# Patient Record
Sex: Female | Born: 2000 | Race: Black or African American | Hispanic: No | Marital: Single | Smoking: Never smoker
Health system: Southern US, Community
[De-identification: ages and names within clinical notes are randomized; demographics above are authoritative.]

## PROBLEM LIST (undated history)

## (undated) ENCOUNTER — Inpatient Hospital Stay (HOSPITAL_COMMUNITY): Payer: Self-pay

## (undated) DIAGNOSIS — D649 Anemia, unspecified: Secondary | ICD-10-CM

## (undated) DIAGNOSIS — F419 Anxiety disorder, unspecified: Secondary | ICD-10-CM

## (undated) DIAGNOSIS — A749 Chlamydial infection, unspecified: Secondary | ICD-10-CM

## (undated) DIAGNOSIS — Z789 Other specified health status: Secondary | ICD-10-CM

## (undated) HISTORY — PX: NO PAST SURGERIES: SHX2092

---
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
## 2019-01-31 IMAGING — US US MFM OB COMP +14 WKS
1 series · 14 of 28 positions shown · non-contrast
Comparison: none

[Series 1: us mfm ob comp +14 wks · 14 of 109 slices shown]
[im 5/109]
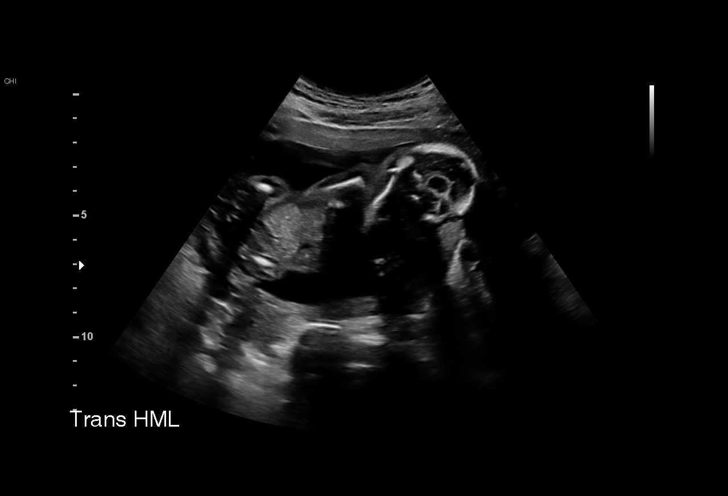
[im 13/109]
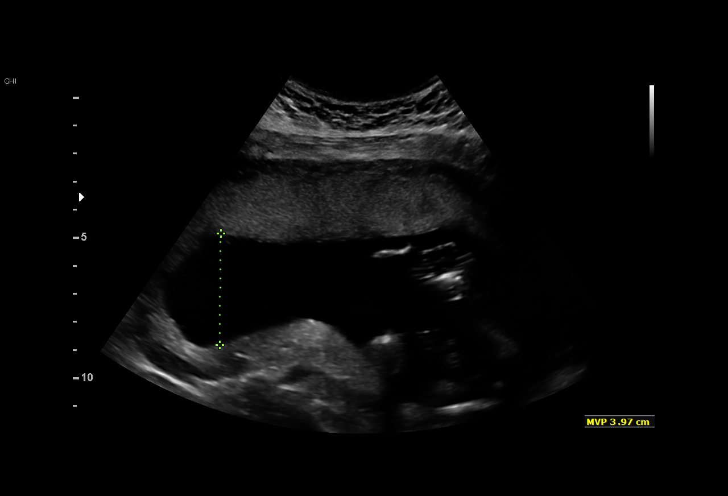
[im 21/109]
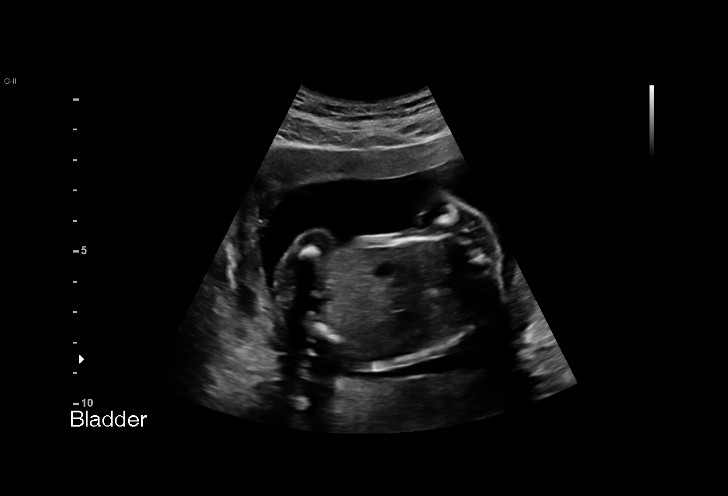
[im 29/109]
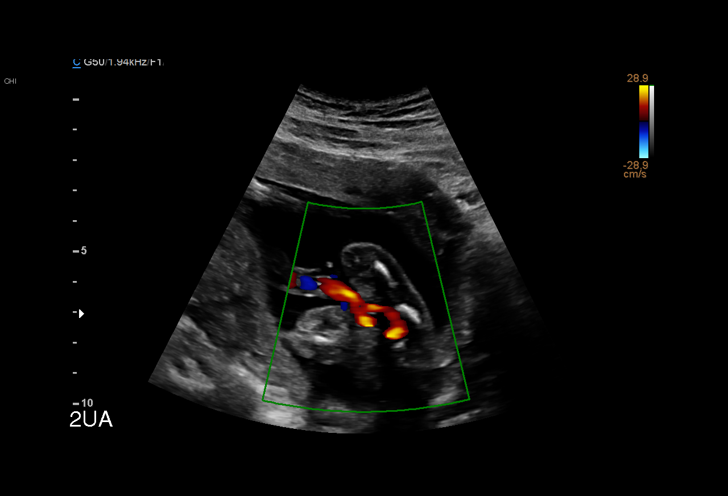
[im 37/109]
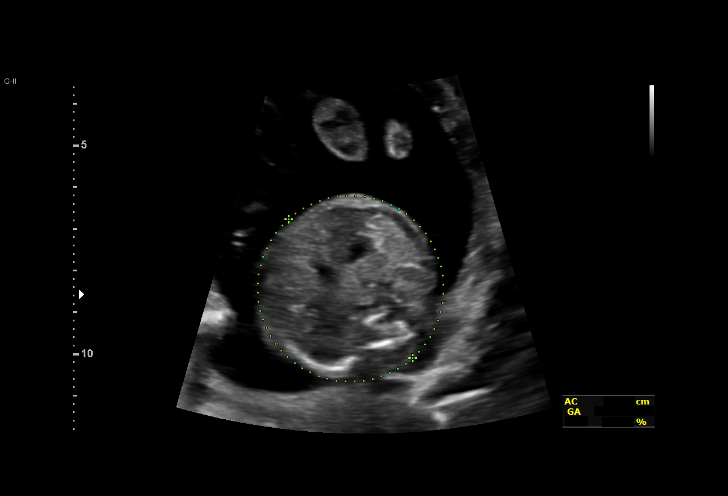
[im 45/109]
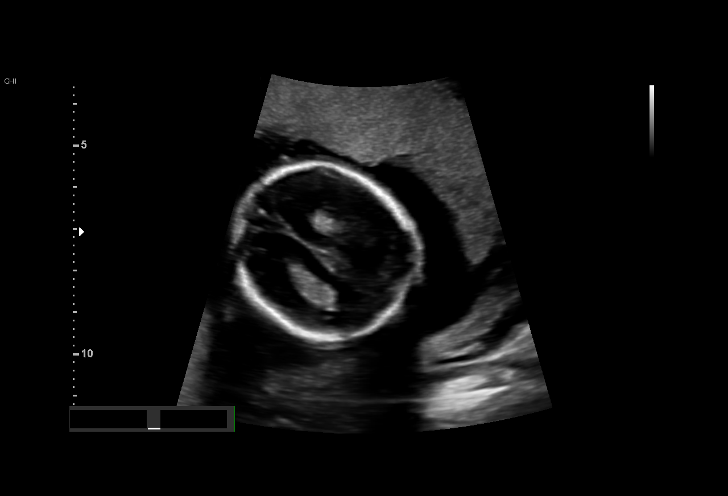
[im 53/109]
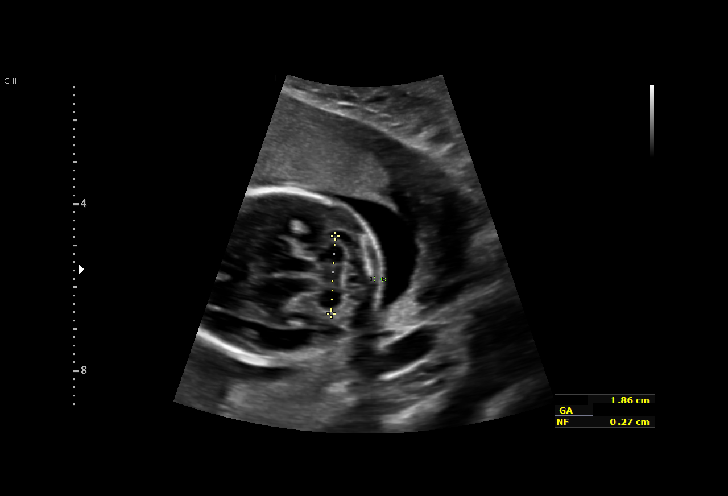
[im 61/109]
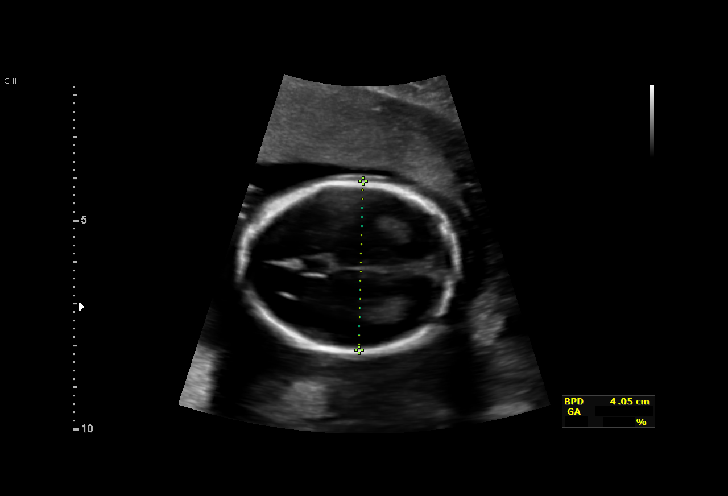
[im 69/109]
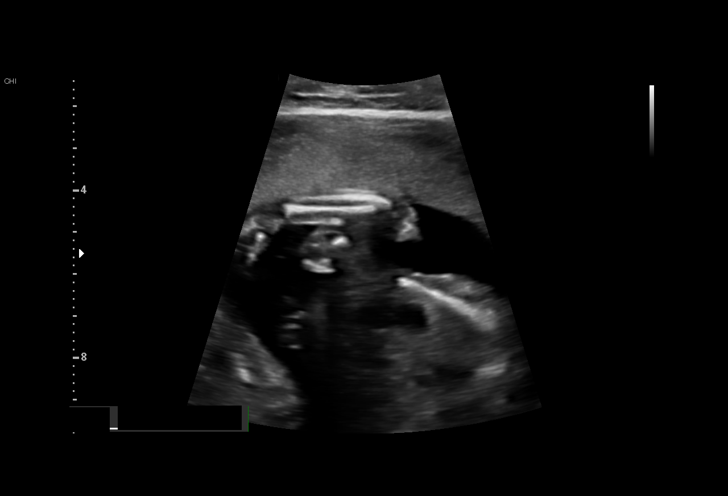
[im 77/109]
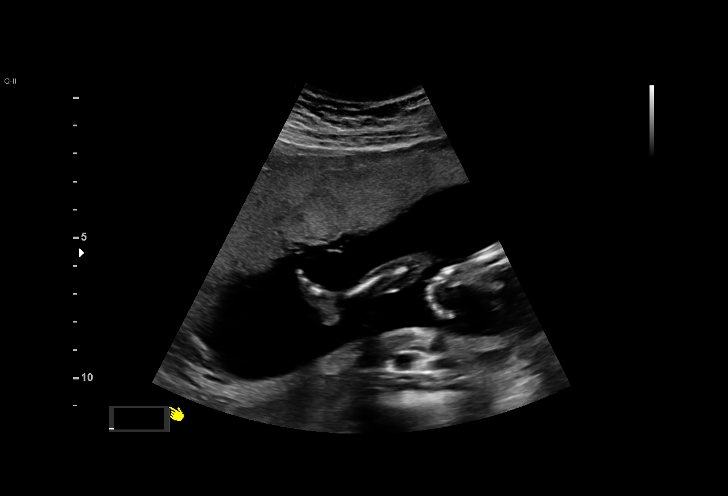
[im 85/109]
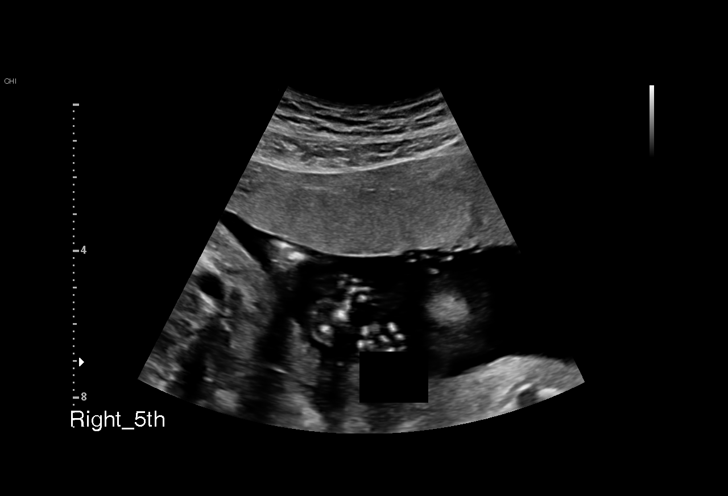
[im 93/109]
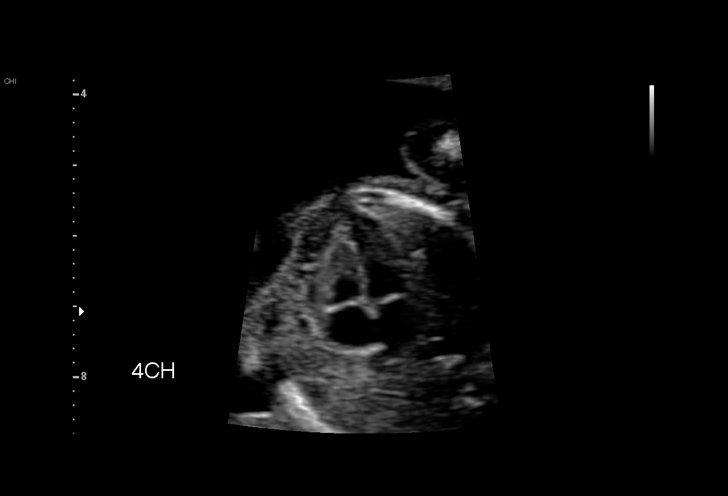
[im 101/109]
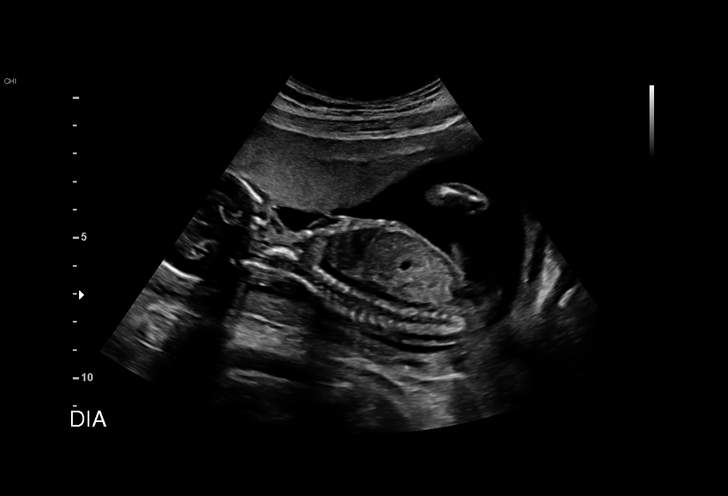
[im 109/109]
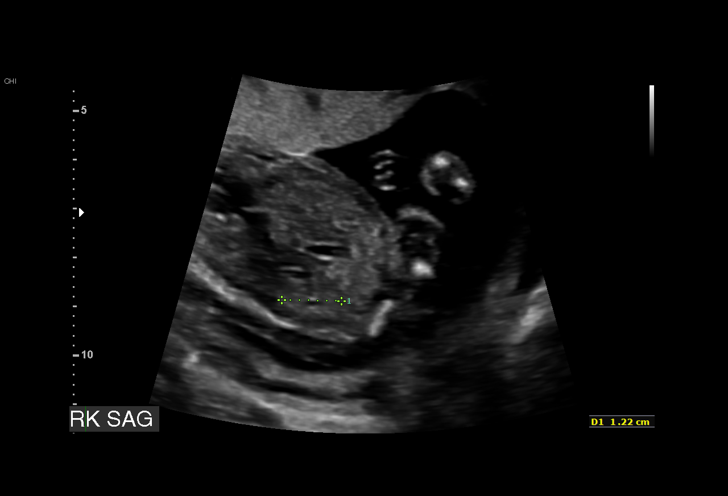

[14 of 28 positions shown; findings below may reference images not displayed]

1  ROSASANA XIGADINHOO           [id number]      [id number]     [id number]
Indications

19 weeks gestation of pregnancy
Encounter for antenatal screening for
malformations
OB History

Gravidity:    2         Term:   1
Fetal Evaluation

Num Of Fetuses:     1
Cardiac Activity:   Observed
Presentation:       Variable
Placenta:           Anterior, above cervical os
P. Cord Insertion:  Visualized, central

Amniotic Fluid
AFI FV:      Subjectively within normal limits

Largest Pocket(cm)
3.97
Biometry

BPD:      40.7  mm     G. Age:  18w 2d         23  %    CI:         75.17  %    70 - 86
FL/HC:       18.6  %    16.1 -
HC:      148.9  mm     G. Age:  18w 0d          7  %    HC/AC:       1.07       1.09 -
AC:      139.3  mm     G. Age:  19w 2d         57  %    FL/BPD:      68.1  %
FL:       27.7  mm     G. Age:  18w 3d         25  %    FL/AC:       19.9  %    20 - 24

Est. FW:     259   gm     0 lb 9 oz     41  %
Gestational Age

LMP:           19w 0d        Date:  05/29/17                 EDD:    03/05/18
U/S Today:     18w 4d                                        EDD:    03/08/18
Best:          19w 0d     Det. By:  LMP  (05/29/17)          EDD:    03/05/18
Anatomy

Cranium:               Appears normal         Aortic Arch:            Not well visualized
Cavum:                 Appears normal         Ductal Arch:            Not well visualized
Ventricles:            Appears normal         Diaphragm:              Appears normal
Choroid Plexus:        Appears normal         Stomach:                Appears normal, left
sided
Cerebellum:            Appears normal         Abdomen:                Appears normal
Posterior Fossa:       Appears normal         Abdominal Wall:         Appears nml (cord
insert, abd wall)
Nuchal Fold:           Appears normal         Cord Vessels:           Appears normal (3
vessel cord)
Face:                  Orbits appear          Kidneys:                Appear normal
normal
Lips:                  Appears normal         Bladder:                Appears normal
Thoracic:              Appears normal         Spine:                  Not well visualized
Heart:                 Appears normal         Upper Extremities:      Appears normal
(4CH, axis, and situs
RVOT:                  Not well visualized    Lower Extremities:      Appears normal
LVOT:                  Not well visualized

Other:  Fetus appears to be a female. Heels and 5th digit visualized. Unable
to visualize the spine, profile and outflow tracts.
Cervix Uterus Adnexa

Cervix
Length:           3.65  cm.
Normal appearance by transabdominal scan.

Uterus
No abnormality visualized.

Left Ovary
Not visualized.

Right Ovary
Within normal limits.

Cul De Sac:   No free fluid seen.

Adnexa:       No abnormality visualized.
Impression

SIUP at 19+0 weeks
Normal detailed fetal anatomy; limited views of profile, heart
and spine
Markers of aneuploidy: none
Normal amniotic fluid volume
Measurements consistent with LMP dating
Recommendations

Follow-up ultrasound in 4 weeks to complete anatomy survey

## 2019-03-11 IMAGING — US US MFM OB FOLLOW-UP
1 series · 14 of 28 positions shown · non-contrast
Comparison: none

[Series 1: us mfm ob follow-up · 14 of 58 slices shown]
[im 3/58]
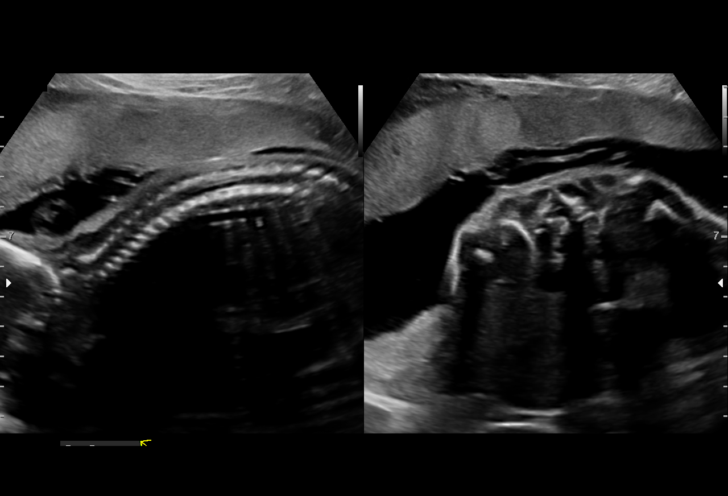
[im 7/58]
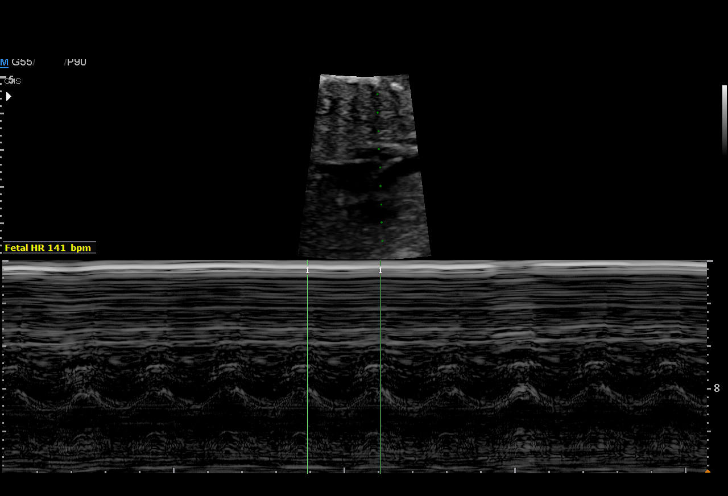
[im 11/58]
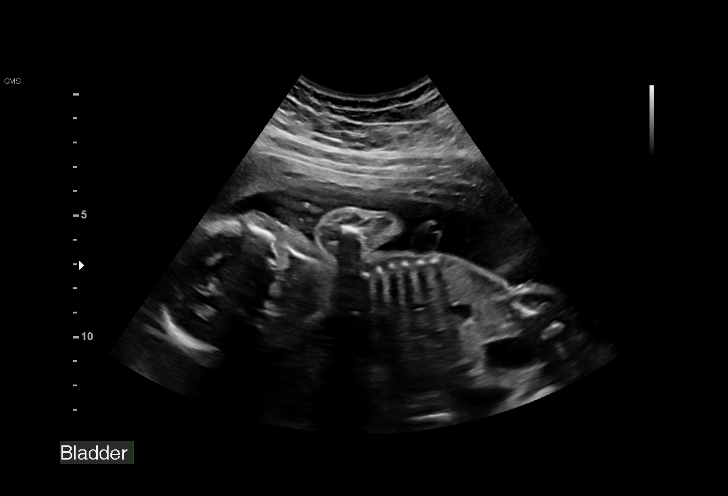
[im 15/58]
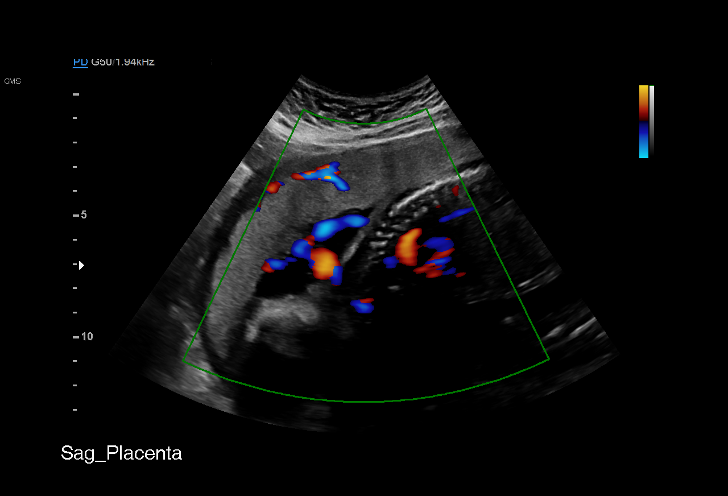
[im 20/58]
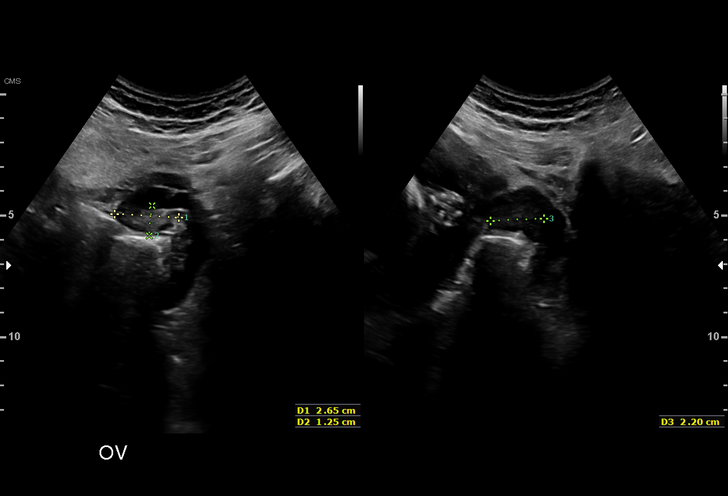
[im 24/58]
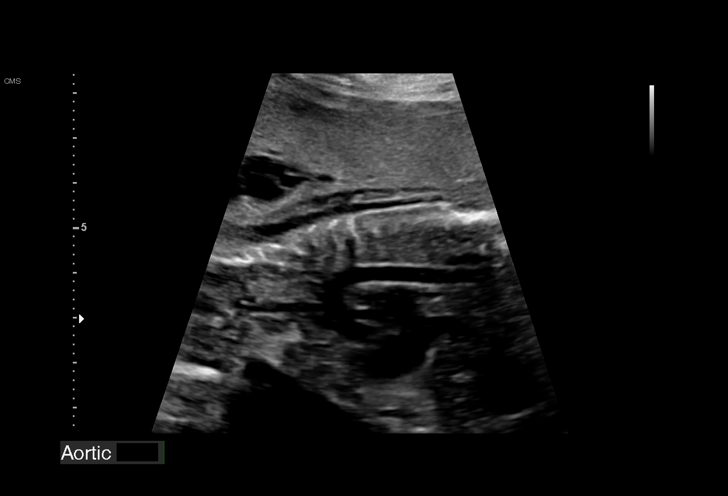
[im 28/58]
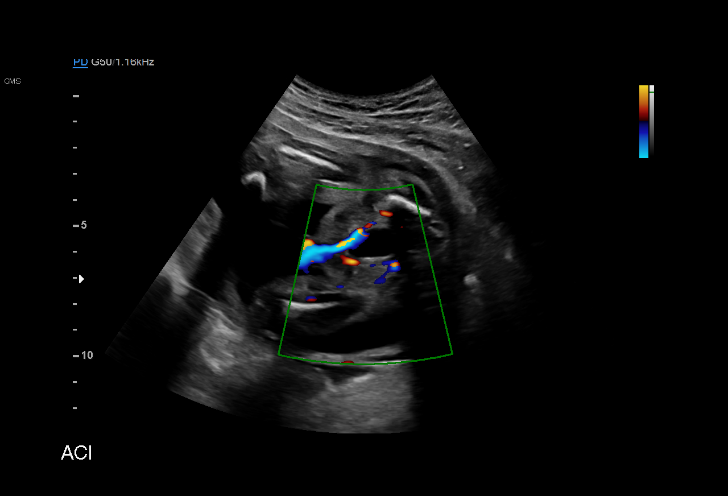
[im 32/58]
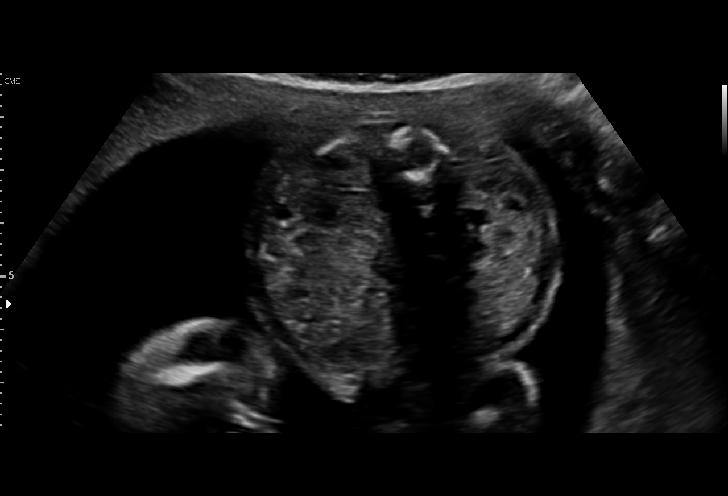
[im 36/58]
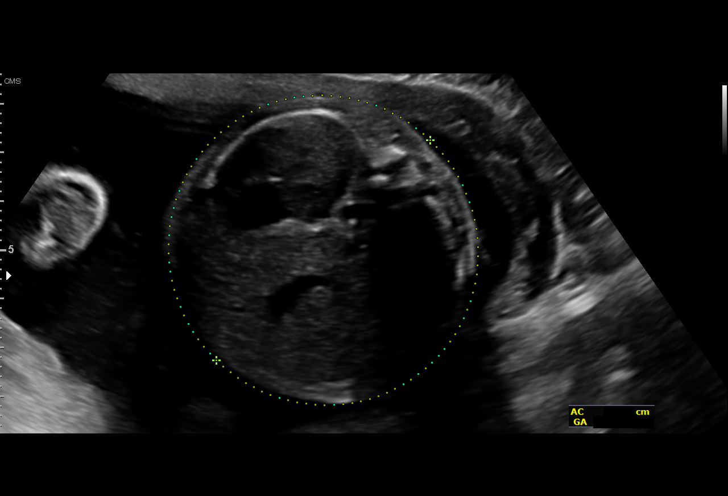
[im 41/58]
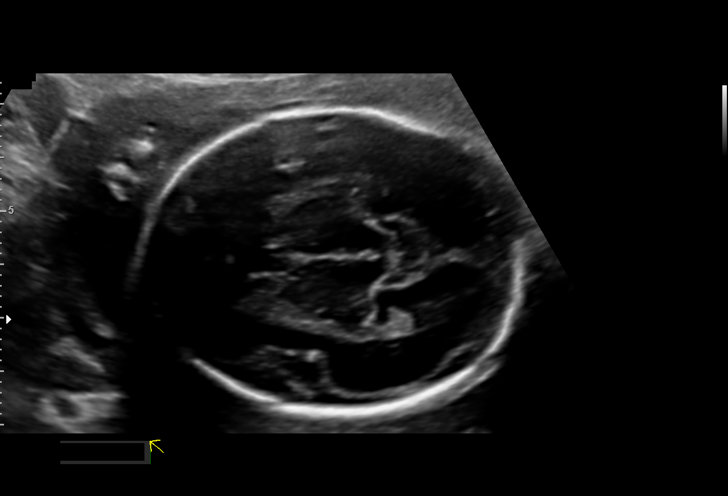
[im 45/58]
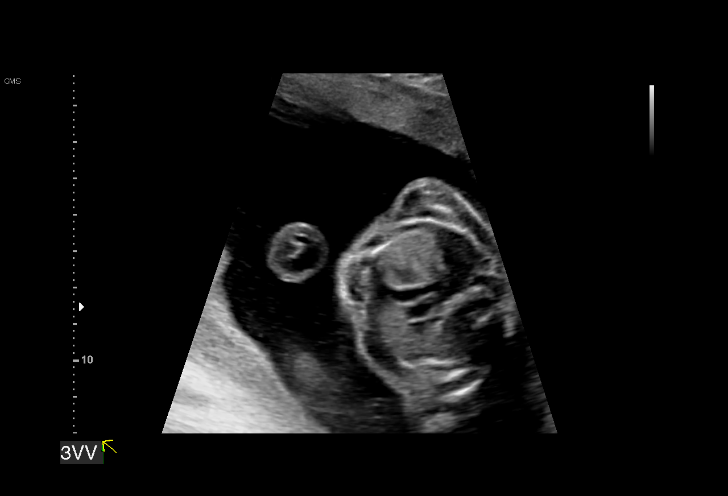
[im 49/58]
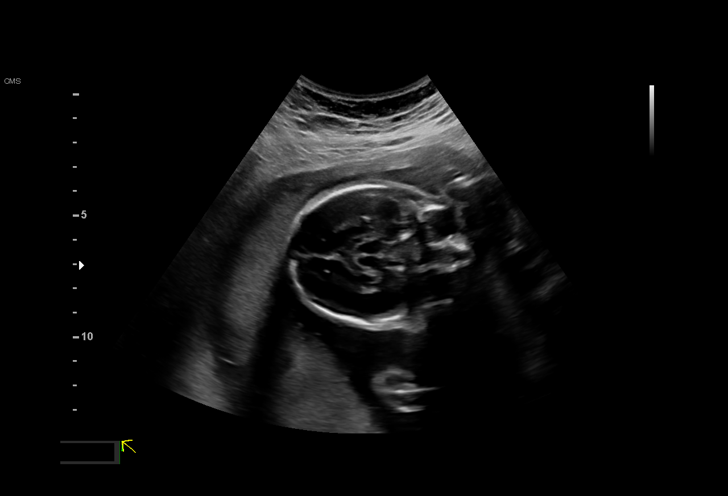
[im 53/58]
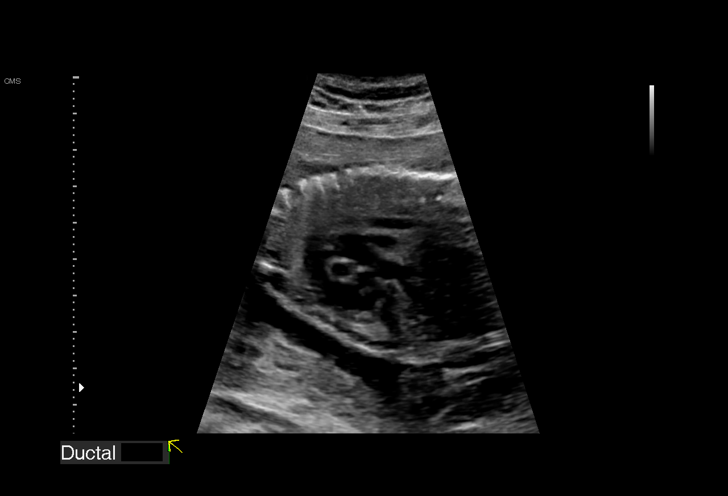
[im 58/58]
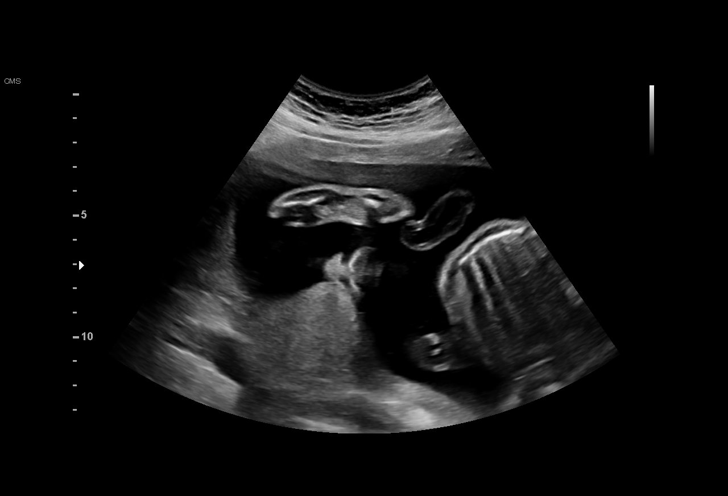

[14 of 28 positions shown; findings below may reference images not displayed]

1  FERIENHAUS ERXLEBEN              [id number]      [id number]     [id number]
Indications

24 weeks gestation of pregnancy
Antenatal follow-up for nonvisualized fetal
anatomy
OB History

Gravidity:    2         Term:   1
Fetal Evaluation

Num Of Fetuses:     1
Fetal Heart         141
Rate(bpm):
Cardiac Activity:   Observed
Presentation:       Breech
Placenta:           Anterior, above cervical os
P. Cord Insertion:  Visualized

Amniotic Fluid
AFI FV:      Subjectively within normal limits

Largest Pocket(cm)
5.17
Biometry

BPD:      56.1  mm     G. Age:  23w 1d          5  %    CI:        71.59   %    70 - 86
FL/HC:      20.9   %    18.7 -
HC:      211.1  mm     G. Age:  23w 1d        < 3  %    HC/AC:      1.07        1.04 -
AC:      198.1  mm     G. Age:  24w 4d         38  %    FL/BPD:     78.6   %    71 - 87
FL:       44.1  mm     G. Age:  24w 4d         35  %    FL/AC:      22.3   %    20 - 24
HUM:      40.7  mm     G. Age:  24w 5d         45  %
Est. FW:     680  gm      1 lb 8 oz     46  %
Gestational Age

LMP:           24w 4d        Date:  05/29/17                 EDD:   03/05/18
U/S Today:     23w 6d                                        EDD:   03/10/18
Best:          24w 4d     Det. By:  LMP  (05/29/17)          EDD:   03/05/18
Anatomy

Cranium:               Appears normal         Aortic Arch:            Appears normal
Cavum:                 Appears normal         Ductal Arch:            Not well visualized
Ventricles:            Appears normal         Diaphragm:              Appears normal
Choroid Plexus:        Appears normal         Stomach:                Appears normal, left
sided
Cerebellum:            Appears normal         Abdomen:                Appears normal
Posterior Fossa:       Appears normal         Abdominal Wall:         Appears nml (cord
insert, abd wall)
Nuchal Fold:           Previously seen        Cord Vessels:           Appears normal (3
vessel cord)
Face:                  Orbits nl; profile not Kidneys:                Appear normal
well visualized
Lips:                  Appears normal         Bladder:                Appears normal
Thoracic:              Appears normal         Spine:                  Appears normal
Heart:                 Appears normal         Upper Extremities:      Previously seen
(4CH, axis, and situs
RVOT:                  Not well visualized    Lower Extremities:      Previously seen
LVOT:                  Appears normal

Other:  Female gender previously seen. Heels and 5th digit previously seen.
Nasal bone visualized. Technically difficult due to fetal position.
Cervix Uterus Adnexa

Cervix
Length:              4  cm.
Normal appearance by transabdominal scan.

Uterus
No abnormality visualized.

Left Ovary
Within normal limits.

Right Ovary
Within normal limits.

Cul De Sac:   No free fluid seen.

Adnexa:       No abnormality visualized.
Impression

SIUP at 24+4 weeks
Normal interval anatomy; anatomic survey complete except
for profile, RVOT and DA
Normal amniotic fluid volume
Appropriate interval growth with EFW at the 46th %tile
Recommendations

Follow-up as clinically indicated

## 2019-03-12 ENCOUNTER — Ambulatory Visit: Payer: Medicaid Other | Admitting: Obstetrics & Gynecology

## 2019-03-21 ENCOUNTER — Ambulatory Visit: Payer: Medicaid Other | Admitting: Obstetrics & Gynecology

## 2019-09-03 ENCOUNTER — Ambulatory Visit: Payer: Medicaid Other | Admitting: Obstetrics & Gynecology

## 2019-09-03 NOTE — Progress Notes (Deleted)
   Patient did not show up today for her scheduled appointment.   Alvin Rubano, MD, FACOG Obstetrician & Gynecologist, [facility], [facility]  

## 2019-10-07 ENCOUNTER — Ambulatory Visit (INDEPENDENT_AMBULATORY_CARE_PROVIDER_SITE_OTHER): Payer: Medicaid Other | Admitting: *Deleted

## 2019-10-07 ENCOUNTER — Other Ambulatory Visit: Payer: Self-pay

## 2019-10-07 ENCOUNTER — Other Ambulatory Visit (HOSPITAL_COMMUNITY)
Admission: RE | Admit: 2019-10-07 | Discharge: 2019-10-07 | Disposition: A | Discharge status: Home / Self Care | Payer: Medicaid Other | Source: Ambulatory Visit | Attending: Obstetrics and Gynecology | Admitting: Obstetrics and Gynecology

## 2019-10-07 VITALS — BP 128/83 | HR 78

## 2019-10-07 DIAGNOSIS — N898 Other specified noninflammatory disorders of vagina: Secondary | ICD-10-CM

## 2019-10-07 NOTE — Progress Notes (Signed)
Patient seen and assessed by nursing staff during this encounter. I have reviewed the chart and agree with the documentation and plan.  Jaynie Collins, MD 10/07/2019 10:41 AM

## 2019-10-07 NOTE — Progress Notes (Signed)
SUBJECTIVE:  19 y.o. female complains of thick vaginal discharge for 3 day(s). Denies abnormal vaginal bleeding or significant pelvic pain or fever. No UTI symptoms. Denies history of known exposure to STD.  No LMP recorded.  OBJECTIVE:  She appears well, afebrile. Urine dipstick: not done.  ASSESSMENT:  Vaginal Discharge  Vaginal Odor   PLAN:  GC, chlamydia, trichomonas, BVAG, CVAG probe sent to lab. Treatment: To be determined once lab results are received ROV prn if symptoms persist or worsen.

## 2019-10-08 ENCOUNTER — Telehealth: Payer: Self-pay

## 2019-10-08 LAB — CERVICOVAGINAL ANCILLARY ONLY
Bacterial Vaginitis (gardnerella): POSITIVE — AB
Candida Glabrata: NEGATIVE
Candida Vaginitis: NEGATIVE
Chlamydia: POSITIVE — AB
Comment: NEGATIVE
Comment: NEGATIVE
Comment: NEGATIVE
Comment: NEGATIVE
Comment: NEGATIVE
Comment: NORMAL
Neisseria Gonorrhea: POSITIVE — AB
Trichomonas: NEGATIVE

## 2019-10-08 MED ORDER — METRONIDAZOLE 500 MG PO TABS: 500.0000 mg | ORAL_TABLET | Freq: Two times a day (BID) | ORAL | 0 refills | Status: DC

## 2019-10-08 MED ORDER — DOXYCYCLINE HYCLATE 100 MG PO CAPS: 100.0000 mg | ORAL_CAPSULE | Freq: Two times a day (BID) | ORAL | 0 refills | Status: DC

## 2019-10-08 NOTE — Telephone Encounter (Signed)
Call patient x 3 to make her aware of her results and to let her know she will need to come into the office get a shot of rocephin to clear up one of her three infection. The rest of her medications have been sent to her pharmacy on file. She should not make her partner or partners aware or her infection and he can be treated at the health department if he choose to.She should not have sex for at least 10-14 days until both partners have been treated.

## 2019-10-08 NOTE — Telephone Encounter (Signed)
-----   Message from Tereso Newcomer, MD sent at 10/08/2019  1:27 PM EDT ----- Regarding: She got it all!! Please treat all of it as per protocol..rushing to OR.   UAA ----- Message ----- From: Interface, Lab In Three Zero Seven Sent: 10/08/2019   1:16 PM EDT To: Tereso Newcomer, MD

## 2019-10-09 ENCOUNTER — Ambulatory Visit (INDEPENDENT_AMBULATORY_CARE_PROVIDER_SITE_OTHER): Payer: Medicaid Other | Admitting: *Deleted

## 2019-10-09 ENCOUNTER — Other Ambulatory Visit: Payer: Self-pay

## 2019-10-09 ENCOUNTER — Telehealth: Payer: Self-pay | Admitting: *Deleted

## 2019-10-09 VITALS — BP 121/77 | HR 78

## 2019-10-09 DIAGNOSIS — A549 Gonococcal infection, unspecified: Secondary | ICD-10-CM

## 2019-10-09 MED ORDER — CEFTRIAXONE SODIUM 500 MG IJ SOLR
500.0000 mg | Freq: Once | INTRAMUSCULAR | Status: AC
  Administered 2019-10-09: 500 mg via INTRAMUSCULAR

## 2019-10-09 NOTE — Progress Notes (Signed)
Patient seen and assessed by nursing staff during this encounter. I have reviewed the chart and agree with the documentation and plan.  Jaynie Collins, MD 10/09/2019 10:45 AM

## 2019-10-09 NOTE — Telephone Encounter (Signed)
Pt called to get her results. Informed of results and treatments needed. Informed pt to inform partner and the need to get tested and treated as well and to not have any unprotected sex for 10 days after both have been treated. Informed pt to pick up meds from pharmacy and that she also needs to come in for an injection. Pt states she will come in tomorrow.

## 2019-10-09 NOTE — Progress Notes (Signed)
Pt here for her Rochepin 500mg  IM injection. Pt tolerated injection well. Pt also will come back in 4 weeks for TOC.

## 2019-10-15 ENCOUNTER — Ambulatory Visit: Payer: Medicaid Other | Admitting: Obstetrics and Gynecology

## 2019-10-15 DIAGNOSIS — Z01419 Encounter for gynecological examination (general) (routine) without abnormal findings: Secondary | ICD-10-CM

## 2019-11-06 ENCOUNTER — Ambulatory Visit: Payer: Medicaid Other

## 2019-12-30 ENCOUNTER — Encounter: Payer: Self-pay | Admitting: Radiology

## 2020-01-06 ENCOUNTER — Other Ambulatory Visit: Payer: Self-pay

## 2020-01-06 ENCOUNTER — Ambulatory Visit (INDEPENDENT_AMBULATORY_CARE_PROVIDER_SITE_OTHER): Payer: Medicaid Other | Admitting: *Deleted

## 2020-01-06 VITALS — BP 123/83 | HR 80

## 2020-01-06 DIAGNOSIS — O3680X0 Pregnancy with inconclusive fetal viability, not applicable or unspecified: Secondary | ICD-10-CM | POA: Diagnosis not present

## 2020-01-06 DIAGNOSIS — Z3A08 8 weeks gestation of pregnancy: Secondary | ICD-10-CM

## 2020-01-06 NOTE — Progress Notes (Signed)
Pt here today to confirm pregnancy. Pt is unsure of LMP, she thinks maybe the middle of amy. Pt had her first positive UPT on 6/25, she then took another one on 7/15.  Decided to do a ultrasound to confirm and get better dating of this pregnancy.     DATING AND VIABILITY SONOGRAM   Kristina Duncan is a 19 y.o. year old G30P2002 with LMP No LMP recorded. which would correlate to  Unknown weeks gestation.  She has irregular menstrual cycles.   She is here today for a confirmatory initial sonogram.    GESTATION: SINGLETON yes     FETAL ACTIVITY:          Heart rate     173          The fetus is current.  GESTATIONAL AGE AND  BIOMETRICS:  Gestational criteria: Estimated Date of Delivery: 08/13/2020 by early ultrasound now at 8 weeks 4 days. Previous Scans:0  GESTATIONAL SAC            mm         weeks  CROWN RUMP LENGTH           2.05 mm         8.4 weeks                                                   AVERAGE EGA(BY THIS SCAN):  8.4 weeks  WORKING EDD( early ultrasound ):  08/13/2020     TECHNICIAN COMMENTS:  Patient informed that the ultrasound is considered a limited obstetric ultrasound and is not intended to be a complete ultrasound exam. Patient also informed that the ultrasound is not being completed with the intent of assessing for fetal or placental anomalies or any pelvic abnormalities. Explained that the purpose of today's ultrasound is to assess for fetal heart rate. Patient acknowledges the purpose of the exam and the limitations of the study.       A copy of this report including all images has been saved and backed up to a second source for retrieval if needed. All measures and details of the anatomical scan, placentation, fluid volume and pelvic anatomy are contained in that report.  Images reviewed by Dr Macon Large. Pt will make New OB appt in about 3 weeks.   Scheryl Marten 01/06/2020 3:00 PM

## 2020-01-06 NOTE — Progress Notes (Signed)
Patient was assessed and managed by nursing staff during this encounter. I have reviewed the chart and agree with the documentation and plan. I have also made any necessary editorial changes.  Jaynie Collins, MD 01/06/2020 4:49 PM

## 2020-01-30 ENCOUNTER — Other Ambulatory Visit (HOSPITAL_COMMUNITY)
Admission: RE | Admit: 2020-01-30 | Discharge: 2020-01-30 | Disposition: A | Discharge status: Home / Self Care | Payer: Medicaid Other | Source: Ambulatory Visit | Attending: Obstetrics and Gynecology | Admitting: Obstetrics and Gynecology

## 2020-01-30 ENCOUNTER — Encounter: Payer: Self-pay | Admitting: Obstetrics and Gynecology

## 2020-01-30 ENCOUNTER — Other Ambulatory Visit: Payer: Self-pay

## 2020-01-30 ENCOUNTER — Ambulatory Visit (INDEPENDENT_AMBULATORY_CARE_PROVIDER_SITE_OTHER): Payer: Medicaid Other | Admitting: Obstetrics and Gynecology

## 2020-01-30 VITALS — BP 120/81 | HR 99 | Wt 221.0 lb

## 2020-01-30 DIAGNOSIS — O0990 Supervision of high risk pregnancy, unspecified, unspecified trimester: Secondary | ICD-10-CM | POA: Insufficient documentation

## 2020-01-30 DIAGNOSIS — Z3A12 12 weeks gestation of pregnancy: Secondary | ICD-10-CM

## 2020-01-30 DIAGNOSIS — Z3491 Encounter for supervision of normal pregnancy, unspecified, first trimester: Secondary | ICD-10-CM

## 2020-01-30 DIAGNOSIS — Z3490 Encounter for supervision of normal pregnancy, unspecified, unspecified trimester: Secondary | ICD-10-CM | POA: Insufficient documentation

## 2020-01-30 DIAGNOSIS — E669 Obesity, unspecified: Secondary | ICD-10-CM

## 2020-01-30 DIAGNOSIS — O99210 Obesity complicating pregnancy, unspecified trimester: Secondary | ICD-10-CM

## 2020-01-30 NOTE — Progress Notes (Signed)
New OB Note  01/30/2020   Clinic: [facility]  Chief Complaint: NOB  Transfer of Care Patient: no  History of Present Illness: Ms. Guardado is a 19 y.o. [id number] @ 12/0 weeks (EDC 3/3, based on 8 wk u/s). Patient doesn't know when her lMP was. Preg complicated by has Chlamydia on their problem list.   Any events prior to today's visit: no Her periods were: irregular She was using no method when she conceived.  She has Negative signs or symptoms of miscarriage or preterm labor  ROS: A 12-point review of systems was performed and negative, except as stated in the above HPI.  OBGYN History: As per HPI. OB History  Gravida Para Term Preterm AB Living  3 2 2     2   SAB TAB Ectopic Multiple Live Births        0 2    # Outcome Date GA Lbr Len/2nd Weight Sex Delivery Anes PTL Lv  3 Current           2 Term 03/12/18 [redacted]w[redacted]d 02:39 / 00:08 8 lb 2.9 oz (3.71 kg) F Vag-Spont EPI  LIV  1 Term 11/07/15 [redacted]w[redacted]d / 00:23 8 lb 2.2 oz (3.69 kg) M Vag-Vacuum EPI  LIV    Any issues with any prior pregnancies: no Prior children are healthy, doing well, and without any problems or issues: yes History of pap smears: n/a.   Past Medical History: Past Medical History:  Diagnosis Date   Chlamydia infection complicating pregnancy 11/05/2015   + chlamydia 08/09/17-->TOC done 09/05/17.    Past Surgical History: Past Surgical History:  Procedure Laterality Date   NO PAST SURGERIES      Family History:  Family History  Problem Relation Age of Onset   Down syndrome Brother    She denies any history of mental retardation, birth defects or genetic disorders in her or the FOB's history  Social History:  Social History   Socioeconomic History   Marital status: Single    Spouse name: Not on file   Number of children: Not on file   Years of education: Not on file   Highest education level: Not on file  Occupational History   Not on file  Tobacco Use   Smoking  status: Never Smoker   Smokeless tobacco: Never Used  Vaping Use   Vaping Use: Never used  Substance and Sexual Activity   Alcohol use: No   Drug use: No   Sexual activity: Yes    Partners: Male    Birth control/protection: Condom  Other Topics Concern   Not on file  Social History Narrative   Not on file   Social Determinants of Health   Financial Resource Strain:    Difficulty of Paying Living Expenses: Not on file  Food Insecurity:    Worried About 09/07/17 in the Last Year: Not on file   Programme researcher, broadcasting/film/video of Food in the Last Year: Not on file  Transportation Needs:    Lack of Transportation (Medical): Not on file   Lack of Transportation (Non-Medical): Not on file  Physical Activity:    Days of Exercise per Week: Not on file   Minutes of Exercise per Session: Not on file  Stress:    Feeling of Stress : Not on file  Social Connections:    Frequency of Communication with Friends and Family: Not on file   Frequency of Social Gatherings with Friends and Family: Not on file  Attends Religious Services: Not on file   Active Member of Clubs or Organizations: Not on file   Attends Banker Meetings: Not on file   Marital Status: Not on file  Intimate Partner Violence:    Fear of Current or Ex-Partner: Not on file   Emotionally Abused: Not on file   Physically Abused: Not on file   Sexually Abused: Not on file    Allergy: No Known Allergies  Health Maintenance:  Mammogram Up to Date: not applicable  Current Outpatient Medications: Prenatal vitamin Physical Exam:   BP 120/81    Pulse 99    Wt 221 lb (100.2 kg)  There is no height or weight on file to calculate BMI. Contractions: Not present Vag. Bleeding: None. Fundal height: not applicable FHTs: 160  General appearance: Well nourished, well developed female in no acute distress.  Cardiovascular: S1, S2 normal, no murmur, rub or gallop, regular rate and  rhythm Respiratory:  Clear to auscultation bilateral. Normal respiratory effort Abdomen: positive bowel sounds and no masses, hernias; diffusely non tender to palpation, non distended Breasts: patient denies any breast s/s. Neuro/Psych:  Normal mood and affect.  Skin:  Warm and dry.   Laboratory: none  Imaging:  Prior u/s confirms dating  Assessment: routine care  Plan: 1. Supervision of high risk pregnancy, antepartum Declines genetics. Offer afp nv. Will set up for anatomy u/s  - CBC/D/Plt+RPR+Rh+ABO+Rub Ab... - Culture, OB Urine - GC/Chlamydia probe amp ([place])not at [facility] - Comprehensive metabolic panel - Hemoglobin A1c - Protein / creatinine ratio, urine - VITAMIN D 25 Hydroxy (Vit-D Deficiency, Fractures) - TSH - [place] MFM OB COMP + 14 WK; Future  Problem list reviewed and updated.  Follow up in 3 weeks.   >50% of 30 min visit spent on counseling and coordination of care.     Cornelia Copa MD Attending Center for [facility])

## 2020-01-31 ENCOUNTER — Encounter: Payer: Self-pay | Admitting: Obstetrics and Gynecology

## 2020-01-31 DIAGNOSIS — R7989 Other specified abnormal findings of blood chemistry: Secondary | ICD-10-CM | POA: Insufficient documentation

## 2020-01-31 LAB — CBC/D/PLT+RPR+RH+ABO+RUB AB...
Antibody Screen: NEGATIVE
Basophils Absolute: 0 10*3/uL (ref 0.0–0.2)
Basos: 0 %
EOS (ABSOLUTE): 0.6 10*3/uL — ABNORMAL HIGH (ref 0.0–0.4)
Eos: 7 %
HCV Ab: 0.1 s/co ratio (ref 0.0–0.9)
HIV Screen 4th Generation wRfx: NONREACTIVE
Hematocrit: 36.5 % (ref 34.0–46.6)
Hemoglobin: 12 g/dL (ref 11.1–15.9)
Hepatitis B Surface Ag: NEGATIVE
Immature Grans (Abs): 0 10*3/uL (ref 0.0–0.1)
Immature Granulocytes: 0 %
Lymphocytes Absolute: 2.5 10*3/uL (ref 0.7–3.1)
Lymphs: 29 %
MCH: 27.6 pg (ref 26.6–33.0)
MCHC: 32.9 g/dL (ref 31.5–35.7)
MCV: 84 fL (ref 79–97)
Monocytes Absolute: 0.4 10*3/uL (ref 0.1–0.9)
Monocytes: 5 %
Neutrophils Absolute: 4.9 10*3/uL (ref 1.4–7.0)
Neutrophils: 59 %
Platelets: 315 10*3/uL (ref 150–450)
RBC: 4.34 x10E6/uL (ref 3.77–5.28)
RDW: 14.9 % (ref 11.7–15.4)
RPR Ser Ql: NONREACTIVE
Rh Factor: POSITIVE
Rubella Antibodies, IGG: 3.6 index (ref >0.99)
WBC: 8.5 10*3/uL (ref 3.4–10.8)

## 2020-01-31 LAB — GC/CHLAMYDIA PROBE AMP (~~LOC~~) NOT AT ARMC
Chlamydia: NEGATIVE
Comment: NEGATIVE
Comment: NORMAL
Neisseria Gonorrhea: NEGATIVE

## 2020-01-31 LAB — COMPREHENSIVE METABOLIC PANEL
ALT: 7 IU/L (ref 0–32)
AST: 13 IU/L (ref 0–40)
Albumin/Globulin Ratio: 1.5 (ref 1.2–2.2)
Albumin: 4.2 g/dL (ref 3.9–5.0)
Alkaline Phosphatase: 79 IU/L (ref 45–106)
BUN/Creatinine Ratio: 13 (ref 9–23)
BUN: 9 mg/dL (ref 6–20)
Bilirubin Total: 0.3 mg/dL (ref 0.0–1.2)
CO2: 19 mmol/L — ABNORMAL LOW (ref 20–29)
Calcium: 9.6 mg/dL (ref 8.7–10.2)
Chloride: 102 mmol/L (ref 96–106)
Creatinine, Ser: 0.69 mg/dL (ref 0.57–1.00)
GFR calc Af Amer: 146 mL/min/{1.73_m2} (ref >59)
GFR calc non Af Amer: 127 mL/min/{1.73_m2} (ref >59)
Globulin, Total: 2.8 g/dL (ref 1.5–4.5)
Glucose: 77 mg/dL (ref 65–99)
Potassium: 4 mmol/L (ref 3.5–5.2)
Sodium: 135 mmol/L (ref 134–144)
Total Protein: 7 g/dL (ref 6.0–8.5)

## 2020-01-31 LAB — TSH: TSH: 2.31 u[IU]/mL (ref 0.450–4.500)

## 2020-01-31 LAB — HEMOGLOBIN A1C
Est. average glucose Bld gHb Est-mCnc: 97 mg/dL
Hgb A1c MFr Bld: 5 % (ref 4.8–5.6)

## 2020-01-31 LAB — PROTEIN / CREATININE RATIO, URINE
Creatinine, Urine: 377.9 mg/dL
Protein, Ur: 35.3 mg/dL
Protein/Creat Ratio: 93 mg/g creat (ref 0–200)

## 2020-01-31 LAB — VITAMIN D 25 HYDROXY (VIT D DEFICIENCY, FRACTURES): Vit D, 25-Hydroxy: 9.7 ng/mL — ABNORMAL LOW (ref 30.0–100.0)

## 2020-01-31 LAB — HCV INTERPRETATION

## 2020-01-31 MED ORDER — VITAMIN D (ERGOCALCIFEROL) 1.25 MG (50000 UNIT) PO CAPS: 50000.0000 [IU] | ORAL_CAPSULE | ORAL | 0 refills | Status: AC

## 2020-01-31 NOTE — Addendum Note (Signed)
Addended by: [place] Bing on: 01/31/2020 09:36 AM   Modules accepted: Orders

## 2020-02-01 LAB — CULTURE, OB URINE

## 2020-02-01 LAB — URINE CULTURE, OB REFLEX

## 2020-02-03 ENCOUNTER — Telehealth: Payer: Self-pay | Admitting: *Deleted

## 2020-02-03 NOTE — Telephone Encounter (Signed)
-----   Message from [place] Bing, MD sent at 01/31/2020  9:36 AM EDT ----- Can you let her know that everything looks good but her vitamin d level is really low so I sent in a weekly pill for her to take? thanks

## 2020-02-03 NOTE — Telephone Encounter (Signed)
Pt informed of low vitamin d and theta Dr Vergie Living has sent in a weekly dose for her to start taking. Pt verbalizes and understands,.

## 2020-02-27 ENCOUNTER — Encounter: Payer: Self-pay | Admitting: Obstetrics and Gynecology

## 2020-02-27 ENCOUNTER — Encounter: Payer: Medicaid Other | Admitting: Obstetrics and Gynecology

## 2020-02-27 ENCOUNTER — Telehealth: Payer: Self-pay | Admitting: Radiology

## 2020-02-27 NOTE — Progress Notes (Signed)
Patient did not keep her OB appointment for 02/27/2020.  Cornelia Copa MD Attending Center for [facility])

## 2020-02-27 NOTE — Telephone Encounter (Signed)
Left message on voicemail for patient to call [facility] to reschedule missed appointment from 02/27/20.

## 2020-03-03 ENCOUNTER — Telehealth: Payer: Self-pay | Admitting: Radiology

## 2020-03-03 NOTE — Telephone Encounter (Signed)
Left message for patient to call [facility] to reschedule appointment from 02/27/20.

## 2020-03-04 ENCOUNTER — Encounter: Payer: Medicaid Other | Admitting: Student

## 2020-03-13 ENCOUNTER — Encounter: Payer: Medicaid Other | Admitting: Nurse Practitioner

## 2020-03-16 ENCOUNTER — Ambulatory Visit (INDEPENDENT_AMBULATORY_CARE_PROVIDER_SITE_OTHER): Payer: Medicaid Other | Admitting: Obstetrics and Gynecology

## 2020-03-16 ENCOUNTER — Other Ambulatory Visit: Payer: Self-pay

## 2020-03-16 VITALS — BP 116/76 | HR 98 | Wt 230.0 lb

## 2020-03-16 DIAGNOSIS — Z3492 Encounter for supervision of normal pregnancy, unspecified, second trimester: Secondary | ICD-10-CM

## 2020-03-16 DIAGNOSIS — Z3A18 18 weeks gestation of pregnancy: Secondary | ICD-10-CM

## 2020-03-16 NOTE — Patient Instructions (Addendum)
You are 18 weeks 4 days today  Your due date is march 3

## 2020-03-17 NOTE — Progress Notes (Signed)
Prenatal Visit Note Date: 03/16/2020 Clinic: [facility]  Subjective:  Kristina Duncan is a 19 y.o. G3P2002 at [redacted]w[redacted]d being seen today for ongoing prenatal care.  She is currently monitored for the following issues for this low-risk pregnancy and has Obesity in pregnancy; History of chlamydia; Supervision of low-risk pregnancy; Obesity (BMI 30-39.9); and Low vitamin D level on their problem list.  Patient reports no complaints.   Contractions: Not present. Vag. Bleeding: None.  Movement: Present. Denies leaking of fluid.   The following portions of the patient's history were reviewed and updated as appropriate: allergies, current medications, past family history, past medical history, past social history, past surgical history and problem list. Problem list updated.  Objective:   Vitals:   03/16/20 1549  BP: 116/76  Pulse: 98  Weight: 230 lb (104.3 kg)    Fetal Status: Fetal Heart Rate (bpm): 152   Movement: Present     General:  Alert, oriented and cooperative. Patient is in no acute distress.  Skin: Skin is warm and dry. No rash noted.   Cardiovascular: Normal heart rate noted  Respiratory: Normal respiratory effort, no problems with respiration noted  Abdomen: Soft, gravid, appropriate for gestational age. Pain/Pressure: Absent     Pelvic:  Cervical exam deferred        Extremities: Normal range of motion.  Edema: None  Mental Status: Normal mood and affect. Normal behavior. Normal judgment and thought content.   Urinalysis:      Assessment and Plan:  Pregnancy: G3P2002 at [redacted]w[redacted]d  1. Encounter for supervision of low-risk pregnancy in second trimester Routine care. Anatomy u/s on 10/7. Declines genetics. D/w pt more re: BC nv.  - Enroll Patient in Babyscripts  2. [redacted] weeks gestation of pregnancy  Preterm labor symptoms and general obstetric precautions including but not limited to vaginal bleeding, contractions, leaking of fluid and fetal movement were  reviewed in detail with the patient. Please refer to After Visit Summary for other counseling recommendations.  Return in about 1 month (around 04/16/2020) for 4-5 week low risk rob.   [place] Bing, MD

## 2020-03-19 ENCOUNTER — Ambulatory Visit: Payer: Medicaid Other | Attending: Obstetrics and Gynecology

## 2020-03-19 ENCOUNTER — Other Ambulatory Visit: Payer: Self-pay

## 2020-03-19 ENCOUNTER — Other Ambulatory Visit: Payer: Self-pay | Admitting: Obstetrics and Gynecology

## 2020-03-19 ENCOUNTER — Other Ambulatory Visit: Payer: Self-pay | Admitting: *Deleted

## 2020-03-19 DIAGNOSIS — O0990 Supervision of high risk pregnancy, unspecified, unspecified trimester: Secondary | ICD-10-CM

## 2020-03-19 DIAGNOSIS — Z362 Encounter for other antenatal screening follow-up: Secondary | ICD-10-CM

## 2020-04-13 ENCOUNTER — Other Ambulatory Visit: Payer: Self-pay

## 2020-04-13 ENCOUNTER — Encounter: Payer: Self-pay | Admitting: Obstetrics & Gynecology

## 2020-04-13 ENCOUNTER — Ambulatory Visit (INDEPENDENT_AMBULATORY_CARE_PROVIDER_SITE_OTHER): Payer: Medicaid Other | Admitting: Obstetrics & Gynecology

## 2020-04-13 VITALS — BP 127/77 | HR 94 | Wt 236.0 lb

## 2020-04-13 DIAGNOSIS — Z3492 Encounter for supervision of normal pregnancy, unspecified, second trimester: Secondary | ICD-10-CM

## 2020-04-13 DIAGNOSIS — Z3A23 23 weeks gestation of pregnancy: Secondary | ICD-10-CM

## 2020-04-13 NOTE — Progress Notes (Signed)
PRENATAL VISIT NOTE  Subjective:  Kristina Duncan is a 19 y.o. G3P2002 at [redacted]w[redacted]d being seen today for ongoing prenatal care.  She is currently monitored for the following issues for this low-risk pregnancy and has Obesity in pregnancy; History of chlamydia; Supervision of low-risk pregnancy; Obesity (BMI 30-39.9); and Low vitamin D level on their problem list.  Patient reports no complaints.  Contractions: Not present. Vag. Bleeding: None.  Movement: Present. Denies leaking of fluid.   The following portions of the patient's history were reviewed and updated as appropriate: allergies, current medications, past family history, past medical history, past social history, past surgical history and problem list.   Objective:   Vitals:   04/13/20 1507  BP: 127/77  Pulse: 94  Weight: 236 lb (107 kg)    Fetal Status: Fetal Heart Rate (bpm): 150   Movement: Present     General:  Alert, oriented and cooperative. Patient is in no acute distress.  Skin: Skin is warm and dry. No rash noted.   Cardiovascular: Normal heart rate noted  Respiratory: Normal respiratory effort, no problems with respiration noted  Abdomen: Soft, gravid, appropriate for gestational age.  Pain/Pressure: Absent     Pelvic: Cervical exam deferred        Extremities: Normal range of motion.  Edema: None  Mental Status: Normal mood and affect. Normal behavior. Normal judgment and thought content.   Imaging: [place] MFM OB DETAIL +14 WK  Result Date: 03/19/2020 ----------------------------------------------------------------------  OBSTETRICS REPORT                       (Signed Final 03/19/2020 06:17 pm) ---------------------------------------------------------------------- Patient Info  ID #:       [id number]                          D.O.B.:  [birth year] (18 yrs)  Name:       Kristina Duncan                  Visit Date: 03/19/2020 08:46 am ---------------------------------------------------------------------- Performed By  Attending:         Ma Rings MD         Ref. Address:     [address]                                                             [place], [place]                                                             [address]  Performed By:     Lenise Arena        [facility]  Referred By:      [place] Bing MD ---------------------------------------------------------------------- Orders  #  Description                           Code        Ordered By  1  [place] MFM OB DETAIL +14 WK               76811.01    CHARLIE PICKENS ----------------------------------------------------------------------  #  Order #                     Accession #                Episode #  1  [id number]                   [id number]                 [id number] ---------------------------------------------------------------------- Indications  Obesity complicating pregnancy, second         O99.212  trimester (BMI 36)  Encounter for antenatal screening for          Z36.3  malformations (no genetic testing)  Encounter for uncertain dates                  Z36.87  [redacted] weeks gestation of pregnancy                Z3A.19 ---------------------------------------------------------------------- Vital Signs  Weight (lb): 230                               Height:        5'7"  BMI:         36.02 ---------------------------------------------------------------------- Fetal Evaluation  Num Of Fetuses:         1  Fetal Heart Rate(bpm):  157  Cardiac Activity:       Observed  Presentation:           Transverse, head to maternal left  Placenta:               Anterior  P. Cord Insertion:      Visualized, central  Amniotic Fluid  AFI FV:  Within normal limits                               Largest Pocket(cm)                              5.17 ---------------------------------------------------------------------- Biometry  BPD:      43.7  mm     G. Age:  19w 2d         35  %    CI:        72.61   %    70 - 86                                                          FL/HC:      18.8   %    16.8 - 19.8  HC:      163.1  mm     G. Age:  19w 0d         20  %    HC/AC:      1.08        1.09 - 1.39  AC:      150.4  mm     G. Age:  20w 2d         69  %    FL/BPD:     70.0   %  FL:       30.6  mm     G. Age:  19w 3d         39  %    FL/AC:      20.3   %    20 - 24  CER:      19.9  mm     G. Age:  19w 2d         47  %  NFT:       5.1  mm  LV:        6.1  mm  CM:        4.3  mm  Est. FW:     314  gm    0 lb 11 oz      59  % ---------------------------------------------------------------------- OB History  Gravidity:    3         Term:   2  Living:       2 ---------------------------------------------------------------------- Gestational Age  U/S Today:     19w 4d                                        EDD:   08/09/20  Best:          19w 4d     Det. By:  U/S (03/19/20)           EDD:   08/09/20 ---------------------------------------------------------------------- Anatomy  Cranium:               Appears normal         Aortic Arch:            Appears normal  Cavum:  Appears normal         Ductal Arch:            Appears normal  Ventricles:            Appears normal         Diaphragm:              Appears normal  Choroid Plexus:        Appears normal         Stomach:                Appears normal, left                                                                        sided  Cerebellum:            Appears normal         Abdomen:                Appears normal  Posterior Fossa:       Appears normal         Abdominal Wall:         Appears nml (cord                                                                        insert, abd wall)  Nuchal Fold:           Appears normal          Cord Vessels:           Appears normal (3                                                                        vessel cord)  Face:                  Appears normal         Kidneys:                Appear normal                         (orbits and profile)  Lips:                  Appears normal         Bladder:                Appears normal  Thoracic:              Appears normal         Spine:                  Appears normal  Heart:                 Appears normal         Upper Extremities:      Appears normal                         (4CH, axis, and                         situs)  RVOT:                  Appears normal         Lower Extremities:      Appears normal  LVOT:                  Appears normal  Other:  Fetus appears to be a female. Heels and 5th digit visualized. Nasal          bone visualized. Open hands visualized.  3VV and 3VTV visualized. ---------------------------------------------------------------------- Cervix Uterus Adnexa  Cervix  Length:            3.9  cm.  Not visualized (advanced GA >24wks)  Uterus  No abnormality visualized.  Right Ovary  Within normal limits.  Left Ovary  Within normal limits.  Cul De Sac  No free fluid seen.  Adnexa  No abnormality visualized. ---------------------------------------------------------------------- Comments  This patient was seen for a detailed fetal anatomy scan due  to maternal obesity.  She is uncertain of her last menstrual  period.  She denies any significant past medical history and denies  any problems in her current pregnancy.  She has not had any screening tests for fetal aneuploidy  drawn in her current pregnancy.  Based on the fetal biometry measurements obtained today,  her EDC was changed to August 09, 2020.  There were no obvious fetal anomalies noted on today's  ultrasound exam.  The patient was informed that anomalies may be missed due  to technical limitations. If the fetus is in a suboptimal position  or maternal habitus is increased,  visualization of the fetus in  the maternal uterus may be impaired.  A follow-up exam was scheduled in 4 weeks to confirm her  dates. ----------------------------------------------------------------------                   Ma RingsVictor Fang, MD Electronically Signed Final Report   03/19/2020 06:17 pm ----------------------------------------------------------------------   Assessment and Plan:  Pregnancy: G3P2002 at 7740w1d 1. [redacted] weeks gestation of pregnancy 2. Encounter for supervision of low-risk pregnancy in second trimester No complaints. Preterm labor symptoms and general obstetric precautions including but not limited to vaginal bleeding, contractions, leaking of fluid and fetal movement were reviewed in detail with the patient. Please refer to After Visit Summary for other counseling recommendations.   Return in about 4 weeks (around 05/11/2020) for 2 hr GTT, 3rd trimester labs, TDap, OFFICE OB Visit.  Future Appointments  Date Time Provider Department Center  06/18/2020  9:[facility]  06/18/2020  9:45 AM [facility]    Jaynie CollinsUgonna Jos Cygan, MD

## 2020-04-13 NOTE — Patient Instructions (Signed)

## 2020-05-12 ENCOUNTER — Other Ambulatory Visit: Payer: Self-pay

## 2020-05-12 ENCOUNTER — Encounter: Payer: Self-pay | Admitting: Obstetrics & Gynecology

## 2020-05-12 ENCOUNTER — Ambulatory Visit (INDEPENDENT_AMBULATORY_CARE_PROVIDER_SITE_OTHER): Payer: Medicaid Other | Admitting: Obstetrics & Gynecology

## 2020-05-12 VITALS — BP 115/74 | HR 89 | Wt 236.8 lb

## 2020-05-12 DIAGNOSIS — O99013 Anemia complicating pregnancy, third trimester: Secondary | ICD-10-CM

## 2020-05-12 DIAGNOSIS — Z3492 Encounter for supervision of normal pregnancy, unspecified, second trimester: Secondary | ICD-10-CM

## 2020-05-12 DIAGNOSIS — Z3A27 27 weeks gestation of pregnancy: Secondary | ICD-10-CM

## 2020-05-12 LAB — CBC
Hematocrit: 30.3 % — ABNORMAL LOW (ref 34.0–46.6)
Hemoglobin: 10.3 g/dL — ABNORMAL LOW (ref 11.1–15.9)
MCH: 28.4 pg (ref 26.6–33.0)
MCHC: 34 g/dL (ref 31.5–35.7)
MCV: 84 fL (ref 79–97)
Platelets: 297 10*3/uL (ref 150–450)
RBC: 3.63 x10E6/uL — ABNORMAL LOW (ref 3.77–5.28)
RDW: 13.4 % (ref 11.7–15.4)
WBC: 10.7 10*3/uL (ref 3.4–10.8)

## 2020-05-12 NOTE — Patient Instructions (Signed)
Preterm Labor and Birth Information ° °The normal length of a pregnancy is 39-41 weeks. Preterm labor is when labor starts before 37 completed weeks of pregnancy. °What are the risk factors for preterm labor? °Preterm labor is more likely to occur in women who: °· Have certain infections during pregnancy such as a bladder infection, sexually transmitted infection, or infection inside the uterus (chorioamnionitis). °· Have a shorter-than-normal cervix. °· Have gone into preterm labor before. °· Have had surgery on their cervix. °· Are younger than age 20 or older than age 20. °· Are African American. °· Are pregnant with twins or multiple babies (multiple gestation). °· Take street drugs or smoke while pregnant. °· Do not gain enough weight while pregnant. °· Became pregnant shortly after having been pregnant. °What are the symptoms of preterm labor? °Symptoms of preterm labor include: °· Cramps similar to those that can happen during a menstrual period. The cramps may happen with diarrhea. °· Pain in the abdomen or lower back. °· Regular uterine contractions that may feel like tightening of the abdomen. °· A feeling of increased pressure in the pelvis. °· Increased watery or bloody mucus discharge from the vagina. °· Water breaking (ruptured amniotic sac). °Why is it important to recognize signs of preterm labor? °It is important to recognize signs of preterm labor because babies who are born prematurely may not be fully developed. This can put them at an increased risk for: °· Long-term (chronic) heart and lung problems. °· Difficulty immediately after birth with regulating body systems, including blood sugar, body temperature, heart rate, and breathing rate. °· Bleeding in the brain. °· Cerebral palsy. °· Learning difficulties. °· Death. °These risks are highest for babies who are born before 34 weeks of pregnancy. °How is preterm labor treated? °Treatment depends on the length of your pregnancy, your condition,  and the health of your baby. It may involve: °· Having a stitch (suture) placed in your cervix to prevent your cervix from opening too early (cerclage). °· Taking or being given medicines, such as: °? Hormone medicines. These may be given early in pregnancy to help support the pregnancy. °? Medicine to stop contractions. °? Medicines to help mature the baby’s lungs. These may be prescribed if the risk of delivery is high. °? Medicines to prevent your baby from developing cerebral palsy. °If the labor happens before 34 weeks of pregnancy, you may need to stay in the hospital. °What should I do if I think I am in preterm labor? °If you think that you are going into preterm labor, call your health care provider right away. °How can I prevent preterm labor in future pregnancies? °To increase your chance of having a full-term pregnancy: °· Do not use any tobacco products, such as cigarettes, chewing tobacco, and e-cigarettes. If you need help quitting, ask your health care provider. °· Do not use street drugs or medicines that have not been prescribed to you during your pregnancy. °· Talk with your health care provider before taking any herbal supplements, even if you have been taking them regularly. °· Make sure you gain a healthy amount of weight during your pregnancy. °· Watch for infection. If you think that you might have an infection, get it checked right away. °· Make sure to tell your health care provider if you have gone into preterm labor before. °This information is not intended to replace advice given to you by your health care provider. Make sure you discuss any questions you have with your   health care provider. °Document Revised: 09/21/2018 Document Reviewed: 10/21/2015 °Elsevier Patient Education © 2020 [employer] ° °

## 2020-05-12 NOTE — Progress Notes (Signed)
   PRENATAL VISIT NOTE  Subjective:  Kristina Duncan is a 19 y.o. G3P2002 at [redacted]w[redacted]d being seen today for ongoing prenatal care.  She is currently monitored for the following issues for this low-risk pregnancy and has Obesity in pregnancy; History of chlamydia; Supervision of low-risk pregnancy; Obesity (BMI 30-39.9); and Low vitamin D level on their problem list.  Patient reports no complaints.  Contractions: Not present. Vag. Bleeding: None.  Movement: Present. Denies leaking of fluid.   The following portions of the patient's history were reviewed and updated as appropriate: allergies, current medications, past family history, past medical history, past social history, past surgical history and problem list.   Objective:   Vitals:   05/12/20 0919  BP: 115/74  Pulse: 89  Weight: 236 lb 12.8 oz (107.4 kg)    Fetal Status:     Movement: Present     General:  Alert, oriented and cooperative. Patient is in no acute distress.  Skin: Skin is warm and dry. No rash noted.   Cardiovascular: Normal heart rate noted  Respiratory: Normal respiratory effort, no problems with respiration noted  Abdomen: Soft, gravid, appropriate for gestational age.  Pain/Pressure: Present     Pelvic: Cervical exam deferred        Extremities: Normal range of motion.  Edema: None  Mental Status: Normal mood and affect. Normal behavior. Normal judgment and thought content.   Assessment and Plan:  Pregnancy: G3P2002 at [redacted]w[redacted]d 1. [redacted] weeks gestation of pregnancy 2. Encounter for supervision of low-risk pregnancy in second trimester Counseled about PP IUD, she is considering this. The patient was counseled on the potential benefits and lack of known risks of COVID vaccination, during pregnancy and breastfeeding, during today's visit. The patient's questions and concerns were addressed today, including safety of the vaccination and potential side effects as they have been published by ACOG and SMFM. The patient has been  informed that there have not been any documented vaccine related injuries, deaths or birth defects to infant or mom after receiving the COVID-19 vaccine to date. The patient has been made aware that although she is not at increased risk of contracting COVID-19 during pregnancy, she is at increased risk of developing severe disease and complications if she contracts COVID-19 while pregnant. All patient questions were addressed during our visit today. The patient is still unsure of her decision for vaccination.  Labs done today, will follow up results and manage accordingly. Will defer vaccines (Flu and Tdap till next visit) - Glucose Tolerance, 2 Hours w/1 Hour - CBC - RPR - HIV Antibody (routine testing w rflx) Preterm labor symptoms and general obstetric precautions including but not limited to vaginal bleeding, contractions, leaking of fluid and fetal movement were reviewed in detail with the patient. Please refer to After Visit Summary for other counseling recommendations.   Return in about 3 weeks (around 06/02/2020) for OFFICE OB VISIT (MD or APP).  Future Appointments  Date Time Provider Department Center  06/18/2020  9:30 AM [facility] NURSE [facility] [place]  06/18/2020  9:45 AM [facility] US5 [facility]    Jaynie Collins, MD

## 2020-05-13 LAB — GLUCOSE TOLERANCE, 2 HOURS W/ 1HR
Glucose, 1 hour: 109 mg/dL (ref 65–179)
Glucose, 2 hour: 82 mg/dL (ref 65–152)
Glucose, Fasting: 75 mg/dL (ref 65–91)

## 2020-05-13 LAB — HIV ANTIBODY (ROUTINE TESTING W REFLEX): HIV Screen 4th Generation wRfx: NONREACTIVE

## 2020-05-13 LAB — RPR: RPR Ser Ql: NONREACTIVE

## 2020-05-14 DIAGNOSIS — O99013 Anemia complicating pregnancy, third trimester: Secondary | ICD-10-CM | POA: Insufficient documentation

## 2020-05-14 MED ORDER — FERROUS SULFATE 325 (65 FE) MG PO TABS: 325.0000 mg | ORAL_TABLET | ORAL | 3 refills | Status: DC

## 2020-05-14 NOTE — Addendum Note (Signed)
Addended by: Jaynie Collins A on: 05/14/2020 01:49 PM   Modules accepted: Orders

## 2020-06-02 ENCOUNTER — Other Ambulatory Visit: Payer: Self-pay

## 2020-06-02 ENCOUNTER — Encounter: Payer: Self-pay | Admitting: Obstetrics & Gynecology

## 2020-06-02 ENCOUNTER — Ambulatory Visit (INDEPENDENT_AMBULATORY_CARE_PROVIDER_SITE_OTHER): Payer: Medicaid Other | Admitting: Obstetrics & Gynecology

## 2020-06-02 VITALS — BP 126/74 | HR 103 | Wt 243.0 lb

## 2020-06-02 DIAGNOSIS — O99013 Anemia complicating pregnancy, third trimester: Secondary | ICD-10-CM

## 2020-06-02 DIAGNOSIS — Z3A30 30 weeks gestation of pregnancy: Secondary | ICD-10-CM

## 2020-06-02 DIAGNOSIS — R102 Pelvic and perineal pain: Secondary | ICD-10-CM

## 2020-06-02 DIAGNOSIS — Z3493 Encounter for supervision of normal pregnancy, unspecified, third trimester: Secondary | ICD-10-CM | POA: Diagnosis not present

## 2020-06-02 DIAGNOSIS — Z23 Encounter for immunization: Secondary | ICD-10-CM | POA: Diagnosis not present

## 2020-06-02 DIAGNOSIS — O26893 Other specified pregnancy related conditions, third trimester: Secondary | ICD-10-CM

## 2020-06-02 MED ORDER — COMFORT FIT MATERNITY SUPP LG MISC: 0 refills | Status: DC

## 2020-06-02 MED ORDER — COMFORT FIT MATERNITY SUPP LG MISC
0 refills | Status: DC
Start: 2020-06-02 — End: 2020-08-04

## 2020-06-02 NOTE — Patient Instructions (Addendum)
   PREGNANCY SUPPORT BELT: You are not alone, Seventy-five percent of women have some sort of abdominal or back pain at some point in their pregnancy. Your baby is growing at a fast pace, which means that your whole body is rapidly trying to adjust to the changes. As your uterus grows, your back may start feeling a bit under stress and this can result in back or abdominal pain that can go from mild, and therefore bearable, to severe pains that will not allow you to sit or lay down comfortably, When it comes to dealing with pregnancy-related pains and cramps, some pregnant women usually prefer natural remedies, which the market is filled with nowadays. For example, wearing a pregnancy support belt can help ease and lessen your discomfort and pain. WHAT ARE THE BENEFITS OF WEARING A PREGNANCY SUPPORT BELT? A pregnancy support belt provides support to the lower portion of the belly taking some of the weight of the growing uterus and distributing to the other parts of your body. It is designed make you comfortable and gives you extra support. Over the years, the pregnancy apparel market has been studying the needs and wants of pregnant women and they have come up with the most comfortable pregnancy support belts that woman could ever ask for. In fact, you will no longer have to wear a stretched-out or bulky pregnancy belt that is visible underneath your clothes and makes you feel even more uncomfortable. Nowadays, a pregnancy support belt is made of comfortable and stretchy materials that will not irritate your skin but will actually make you feel at ease and you will not even notice you are wearing it. They are easy to put on and adjust during the day and can be worn at night for additional support.  BENEFITS: . Relives Back pain . Relieves Abdominal Muscle and Leg Pain . Stabilizes the Pelvic Ring . Offers a Cushioned Abdominal Lift Pad . Relieves pressure on the Sciatic Nerve Within Minutes WHERE TO GET  YOUR PREGNANCY BELT: [employer] [phone] @[phone]  [address] [place], [place]    Return to office for any scheduled appointments. Call the office or go to the MAU at [facility] if:  You begin to have strong, frequent contractions  Your water breaks.  Sometimes it is a big gush of fluid, sometimes it is just a trickle that keeps getting your panties wet or running down your legs  You have vaginal bleeding.  It is normal to have a small amount of spotting if your cervix was checked.   You do not feel your baby moving like normal.  If you do not, get something to eat and drink and lay down and focus on feeling your baby move.   If your baby is still not moving like normal, you should call the office or go to MAU.  Any other obstetric concerns.

## 2020-06-02 NOTE — Progress Notes (Signed)
   PRENATAL VISIT NOTE  Subjective:  Kristina Duncan is a 19 y.o. G3P2002 at [redacted]w[redacted]d being seen today for ongoing prenatal care.  She is currently monitored for the following issues for this low-risk pregnancy and has Obesity in pregnancy; History of chlamydia; Supervision of low-risk pregnancy; Obesity (BMI 30-39.9); Low vitamin D level; and Anemia of pregnancy in third trimester on their problem list.  Patient reports pelvic and lower back pain due to enlarging pregnancy.  Contractions: Irritability. Vag. Bleeding: None.  Movement: Present. Denies leaking of fluid.   The following portions of the patient's history were reviewed and updated as appropriate: allergies, current medications, past family history, past medical history, past social history, past surgical history and problem list.   Objective:   Vitals:   06/02/20 1443  BP: 126/74  Pulse: (!) 103  Weight: 243 lb (110.2 kg)    Fetal Status: Fetal Heart Rate (bpm): 137   Movement: Present     General:  Alert, oriented and cooperative. Patient is in no acute distress.  Skin: Skin is warm and dry. No rash noted.   Cardiovascular: Normal heart rate noted  Respiratory: Normal respiratory effort, no problems with respiration noted  Abdomen: Soft, gravid, appropriate for gestational age.  Pain/Pressure: Present     Pelvic: Cervical exam deferred        Extremities: Normal range of motion.  Edema: None  Mental Status: Normal mood and affect. Normal behavior. Normal judgment and thought content.   Assessment and Plan:  Pregnancy: G3P2002 at [redacted]w[redacted]d 1. Pelvic and lower back pain affecting pregnancy in third trimester, antepartum Maternity belt prescribed. Will monitor response. - Elastic Bandages & Supports (COMFORT FIT MATERNITY SUPP LG) MISC; Please give patient one maternity belt  Dispense: 1 each; Refill: 0  2. Anemia of pregnancy in third trimester Continue oral iron therapy.   3. [redacted] weeks gestation of pregnancy 4. Encounter  for supervision of low-risk pregnancy in third trimester Vaccines given today except COVID which she adamantly declines. - Tdap vaccine greater than or equal to 7yo IM - Flu Vaccine QUAD 36+ mos IM (Fluarix, Quad PF) Preterm labor symptoms and general obstetric precautions including but not limited to vaginal bleeding, contractions, leaking of fluid and fetal movement were reviewed in detail with the patient. Please refer to After Visit Summary for other counseling recommendations.   Return in about 2 weeks (around 06/16/2020) for OFFICE OB VISIT (MD or APP).  Future Appointments  Date Time Provider Department Center  06/18/2020  9:30 AM [facility] NURSE [facility]  06/18/2020  9:45 AM [facility] US5 [facility]    Jaynie Collins, MD

## 2020-06-13 NOTE — L&D Delivery Note (Signed)
OB/GYN Faculty Practice Delivery Note  Kristina Duncan is a 20 y.o. G2B6389 s/p SVD at [redacted]w[redacted]d. She was admitted for labor.   ROM: 10h 41m with clear fluid GBS Status:  Negative/-- (02/01 1458) Maximum Maternal Temperature: 98.7  Labor Progress: . Initial SVE:  5cm. She then progressed to complete.   Delivery Date/Time: 2/21 at 03:57 Delivery: Called to room and patient was complete and pushing. Head delivered LOA. No nuchal cord present. Shoulder and body delivered in usual fashion. Infant with spontaneous cry, placed on mother's abdomen, dried and stimulated. Cord clamped x 2 after 1-minute delay, and cut by Davina RN. Cord blood drawn. Placenta delivered spontaneously with gentle cord traction. Fundus firm with massage and Pitocin. Labia, perineum, vagina, and cervix inspected inspected with vaginal superficial abrasions, no lacerations requiring repair. Baby Weight: pending  Placenta: Sent to L&D Complications: None Lacerations: none EBL: 75 mL Analgesia: Epidural   Infant:  APGAR (1 MIN): 9   APGAR (5 MINS): 9   APGAR (10 MINS):     Casper Harrison, MD [facility] Family Medicine Fellow, [facility] for [facility], [facility] 08/03/2020, 4:44 AM

## 2020-06-16 ENCOUNTER — Encounter: Payer: Medicaid Other | Admitting: Family Medicine

## 2020-06-18 ENCOUNTER — Ambulatory Visit: Payer: Medicaid Other | Admitting: *Deleted

## 2020-06-18 ENCOUNTER — Other Ambulatory Visit: Payer: Self-pay

## 2020-06-18 ENCOUNTER — Ambulatory Visit: Payer: Medicaid Other | Attending: Obstetrics and Gynecology

## 2020-06-18 ENCOUNTER — Encounter: Payer: Self-pay | Admitting: *Deleted

## 2020-06-18 DIAGNOSIS — D649 Anemia, unspecified: Secondary | ICD-10-CM

## 2020-06-18 DIAGNOSIS — O99013 Anemia complicating pregnancy, third trimester: Secondary | ICD-10-CM | POA: Diagnosis not present

## 2020-06-18 DIAGNOSIS — Z362 Encounter for other antenatal screening follow-up: Secondary | ICD-10-CM | POA: Insufficient documentation

## 2020-06-18 DIAGNOSIS — E669 Obesity, unspecified: Secondary | ICD-10-CM

## 2020-06-18 DIAGNOSIS — O322XX0 Maternal care for transverse and oblique lie, not applicable or unspecified: Secondary | ICD-10-CM

## 2020-06-18 DIAGNOSIS — Z3687 Encounter for antenatal screening for uncertain dates: Secondary | ICD-10-CM

## 2020-06-18 DIAGNOSIS — O99213 Obesity complicating pregnancy, third trimester: Secondary | ICD-10-CM

## 2020-06-18 DIAGNOSIS — Z3A32 32 weeks gestation of pregnancy: Secondary | ICD-10-CM

## 2020-06-30 ENCOUNTER — Encounter: Payer: Medicaid Other | Admitting: Obstetrics & Gynecology

## 2020-07-14 ENCOUNTER — Other Ambulatory Visit: Payer: Self-pay

## 2020-07-14 ENCOUNTER — Ambulatory Visit (INDEPENDENT_AMBULATORY_CARE_PROVIDER_SITE_OTHER): Payer: Medicaid Other | Admitting: Family Medicine

## 2020-07-14 ENCOUNTER — Other Ambulatory Visit (HOSPITAL_COMMUNITY)
Admission: RE | Admit: 2020-07-14 | Discharge: 2020-07-14 | Disposition: A | Discharge status: Home / Self Care | Payer: Medicaid Other | Source: Ambulatory Visit | Attending: Family Medicine | Admitting: Family Medicine

## 2020-07-14 VITALS — BP 121/74 | HR 112 | Wt 245.0 lb

## 2020-07-14 DIAGNOSIS — Z3493 Encounter for supervision of normal pregnancy, unspecified, third trimester: Secondary | ICD-10-CM | POA: Insufficient documentation

## 2020-07-14 DIAGNOSIS — O99210 Obesity complicating pregnancy, unspecified trimester: Secondary | ICD-10-CM

## 2020-07-14 DIAGNOSIS — K219 Gastro-esophageal reflux disease without esophagitis: Secondary | ICD-10-CM

## 2020-07-14 MED ORDER — OMEPRAZOLE MAGNESIUM 20 MG PO TBEC
20.0000 mg | DELAYED_RELEASE_TABLET | Freq: Every day | ORAL | 3 refills | Status: DC
Start: 2020-07-14 — End: 2020-08-04

## 2020-07-14 MED ORDER — BLOOD PRESSURE KIT DEVI: 1.0000 | 0 refills | Status: DC

## 2020-07-14 NOTE — Patient Instructions (Signed)

## 2020-07-14 NOTE — Progress Notes (Signed)
   PRENATAL VISIT NOTE  Subjective:  Kristina Duncan is a 20 y.o. G3P2002 at [redacted]w[redacted]d being seen today for ongoing prenatal care.  She is currently monitored for the following issues for this low-risk pregnancy and has Obesity in pregnancy; History of chlamydia; Supervision of low-risk pregnancy; Obesity (BMI 30-39.9); Low vitamin D level; and Anemia of pregnancy in third trimester on their problem list.  Patient reports no complaints.  Contractions: Irritability. Vag. Bleeding: None.  Movement: Present. Denies leaking of fluid.   The following portions of the patient's history were reviewed and updated as appropriate: allergies, current medications, past family history, past medical history, past social history, past surgical history and problem list.   Objective:   Vitals:   07/14/20 1050  BP: 121/74  Pulse: (!) 112  Weight: 245 lb (111.1 kg)    Fetal Status: Fetal Heart Rate (bpm): 139 Fundal Height: 35 cm Movement: Present  Presentation: Vertex  General:  Alert, oriented and cooperative. Patient is in no acute distress.  Skin: Skin is warm and dry. No rash noted.   Cardiovascular: Normal heart rate noted  Respiratory: Normal respiratory effort, no problems with respiration noted  Abdomen: Soft, gravid, appropriate for gestational age.  Pain/Pressure: Present     Pelvic: Cervical exam performed in the presence of a chaperone Dilation: 1 Effacement (%): Thick Station: -3  Extremities: Normal range of motion.  Edema: None  Mental Status: Normal mood and affect. Normal behavior. Normal judgment and thought content.   Assessment and Plan:  Pregnancy: G3P2002 at [redacted]w[redacted]d 1. Encounter for supervision of low-risk pregnancy in third trimester Cultures today - Strep Gp B NAA - GC/Chlamydia probe amp ([place])not at [facility]  2. Obesity in pregnancy   3. Gastroesophageal reflux disease without esophagitis Trial of PPI - omeprazole (PRILOSEC OTC) 20 MG tablet; Take 1 tablet (20 mg total)  by mouth daily.  Dispense: 30 tablet; Refill: 3  Preterm labor symptoms and general obstetric precautions including but not limited to vaginal bleeding, contractions, leaking of fluid and fetal movement were reviewed in detail with the patient. Please refer to After Visit Summary for other counseling recommendations.   Return in 1 week (on 07/21/2020).  Future Appointments  Date Time Provider Department Center  07/22/2020  1:00 PM Federico Flake, MD [facility]  07/28/2020  1:00 PM Anyanwu, Jethro Bastos, MD [facility]  08/04/2020 11:00 AM [place] Bing, MD [facility]    Reva Bores, MD

## 2020-07-14 NOTE — Addendum Note (Signed)
Addended by: Scheryl Marten on: 07/14/2020 02:09 PM   Modules accepted: Orders

## 2020-07-15 LAB — GC/CHLAMYDIA PROBE AMP (~~LOC~~) NOT AT ARMC
Chlamydia: NEGATIVE
Comment: NEGATIVE
Comment: NORMAL
Neisseria Gonorrhea: NEGATIVE

## 2020-07-16 LAB — STREP GP B NAA: Strep Gp B NAA: NEGATIVE

## 2020-07-22 ENCOUNTER — Telehealth: Payer: Medicaid Other | Admitting: Family Medicine

## 2020-07-28 ENCOUNTER — Encounter: Payer: Medicaid Other | Admitting: Obstetrics & Gynecology

## 2020-08-02 ENCOUNTER — Inpatient Hospital Stay (HOSPITAL_COMMUNITY)
Admission: AD | Admit: 2020-08-02 | Discharge: 2020-08-04 | DRG: 807 | Disposition: A | Discharge status: Home / Self Care | Payer: Medicaid Other | Attending: Obstetrics & Gynecology | Admitting: Obstetrics & Gynecology

## 2020-08-02 ENCOUNTER — Other Ambulatory Visit: Payer: Self-pay

## 2020-08-02 ENCOUNTER — Inpatient Hospital Stay (HOSPITAL_COMMUNITY): Payer: Medicaid Other | Admitting: Anesthesiology

## 2020-08-02 ENCOUNTER — Encounter (HOSPITAL_COMMUNITY): Payer: Self-pay | Admitting: Obstetrics & Gynecology

## 2020-08-02 DIAGNOSIS — Z20822 Contact with and (suspected) exposure to covid-19: Secondary | ICD-10-CM | POA: Diagnosis present

## 2020-08-02 DIAGNOSIS — O99214 Obesity complicating childbirth: Secondary | ICD-10-CM | POA: Diagnosis present

## 2020-08-02 DIAGNOSIS — O139 Gestational [pregnancy-induced] hypertension without significant proteinuria, unspecified trimester: Secondary | ICD-10-CM

## 2020-08-02 DIAGNOSIS — Z3A39 39 weeks gestation of pregnancy: Secondary | ICD-10-CM | POA: Diagnosis not present

## 2020-08-02 DIAGNOSIS — D509 Iron deficiency anemia, unspecified: Secondary | ICD-10-CM | POA: Diagnosis present

## 2020-08-02 DIAGNOSIS — O26893 Other specified pregnancy related conditions, third trimester: Secondary | ICD-10-CM | POA: Diagnosis present

## 2020-08-02 DIAGNOSIS — O99210 Obesity complicating pregnancy, unspecified trimester: Secondary | ICD-10-CM | POA: Diagnosis present

## 2020-08-02 DIAGNOSIS — O9902 Anemia complicating childbirth: Secondary | ICD-10-CM | POA: Diagnosis present

## 2020-08-02 DIAGNOSIS — O134 Gestational [pregnancy-induced] hypertension without significant proteinuria, complicating childbirth: Principal | ICD-10-CM | POA: Diagnosis present

## 2020-08-02 DIAGNOSIS — O99013 Anemia complicating pregnancy, third trimester: Secondary | ICD-10-CM | POA: Diagnosis present

## 2020-08-02 DIAGNOSIS — Z8619 Personal history of other infectious and parasitic diseases: Secondary | ICD-10-CM | POA: Diagnosis present

## 2020-08-02 HISTORY — DX: Anxiety disorder, unspecified: F41.9

## 2020-08-02 HISTORY — DX: Anemia, unspecified: D64.9

## 2020-08-02 LAB — COMPREHENSIVE METABOLIC PANEL
ALT: 11 U/L (ref 0–44)
AST: 14 U/L — ABNORMAL LOW (ref 15–41)
Albumin: 2.8 g/dL — ABNORMAL LOW (ref 3.5–5.0)
Alkaline Phosphatase: 164 U/L — ABNORMAL HIGH (ref 38–126)
Anion gap: 7 (ref 5–15)
BUN: 6 mg/dL (ref 6–20)
CO2: 21 mmol/L — ABNORMAL LOW (ref 22–32)
Calcium: 8.6 mg/dL — ABNORMAL LOW (ref 8.9–10.3)
Chloride: 107 mmol/L (ref 98–111)
Creatinine, Ser: 0.69 mg/dL (ref 0.44–1.00)
GFR, Estimated: >60 mL/min (ref >60)
Glucose, Bld: 71 mg/dL (ref 70–99)
Potassium: 4.2 mmol/L (ref 3.5–5.1)
Sodium: 135 mmol/L (ref 135–145)
Total Bilirubin: 0.4 mg/dL (ref 0.3–1.2)
Total Protein: 6.5 g/dL (ref 6.5–8.1)

## 2020-08-02 LAB — CBC
HCT: 31.2 % — ABNORMAL LOW (ref 36.0–46.0)
Hemoglobin: 9.6 g/dL — ABNORMAL LOW (ref 12.0–15.0)
MCH: 24.4 pg — ABNORMAL LOW (ref 26.0–34.0)
MCHC: 30.8 g/dL (ref 30.0–36.0)
MCV: 79.4 fL — ABNORMAL LOW (ref 80.0–100.0)
Platelets: 276 10*3/uL (ref 150–400)
RBC: 3.93 MIL/uL (ref 3.87–5.11)
RDW: 14.4 % (ref 11.5–15.5)
WBC: 9.6 10*3/uL (ref 4.0–10.5)
nRBC: 0 % (ref 0.0–0.2)

## 2020-08-02 LAB — TYPE AND SCREEN
ABO/RH(D): A POS
ABO/RH(D): A POS
Antibody Screen: NEGATIVE
Antibody Screen: NEGATIVE

## 2020-08-02 LAB — RESP PANEL BY RT-PCR (FLU A&B, COVID) ARPGX2
Influenza A by PCR: NEGATIVE
Influenza B by PCR: NEGATIVE
SARS Coronavirus 2 by RT PCR: NEGATIVE

## 2020-08-02 MED ORDER — OXYCODONE-ACETAMINOPHEN 5-325 MG PO TABS
1.0000 | ORAL_TABLET | ORAL | Status: DC | PRN
Start: 2020-08-02 — End: 2020-08-03

## 2020-08-02 MED ORDER — OXYTOCIN-SODIUM CHLORIDE 30-0.9 UT/500ML-% IV SOLN
1.0000 m[IU]/min | INTRAVENOUS | Status: DC
  Administered 2020-08-02: 2 m[IU]/min via INTRAVENOUS

## 2020-08-02 MED ORDER — EPHEDRINE 5 MG/ML INJ: 10.0000 mg | INTRAVENOUS | Status: DC | PRN

## 2020-08-02 MED ORDER — OXYCODONE-ACETAMINOPHEN 5-325 MG PO TABS: 1.0000 | ORAL_TABLET | ORAL | Status: DC | PRN

## 2020-08-02 MED ORDER — LIDOCAINE HCL (PF) 1 % IJ SOLN: 30.0000 mL | INTRAMUSCULAR | Status: DC | PRN

## 2020-08-02 MED ORDER — LIDOCAINE-EPINEPHRINE (PF) 2 %-1:200000 IJ SOLN
INTRAMUSCULAR | Status: DC | PRN
  Administered 2020-08-02: 5 mL via EPIDURAL

## 2020-08-02 MED ORDER — FLEET ENEMA 7-19 GM/118ML RE ENEM: 1.0000 | ENEMA | RECTAL | Status: DC | PRN

## 2020-08-02 MED ORDER — ONDANSETRON HCL 4 MG/2ML IJ SOLN: 4.0000 mg | Freq: Four times a day (QID) | INTRAMUSCULAR | Status: DC | PRN

## 2020-08-02 MED ORDER — OXYTOCIN BOLUS FROM INFUSION
333.0000 mL | Freq: Once | INTRAVENOUS | Status: AC
  Administered 2020-08-03: 333 mL via INTRAVENOUS

## 2020-08-02 MED ORDER — OXYTOCIN-SODIUM CHLORIDE 30-0.9 UT/500ML-% IV SOLN
2.5000 [IU]/h | INTRAVENOUS | Status: DC
  Filled 2020-08-02: qty 500

## 2020-08-02 MED ORDER — SOD CITRATE-CITRIC ACID 500-334 MG/5ML PO SOLN: 30.0000 mL | ORAL | Status: DC | PRN

## 2020-08-02 MED ORDER — OXYCODONE-ACETAMINOPHEN 5-325 MG PO TABS: 2.0000 | ORAL_TABLET | ORAL | Status: DC | PRN

## 2020-08-02 MED ORDER — FENTANYL CITRATE (PF) 100 MCG/2ML IJ SOLN
100.0000 ug | INTRAMUSCULAR | Status: DC | PRN
Start: 2020-08-02 — End: 2020-08-03

## 2020-08-02 MED ORDER — LACTATED RINGERS IV SOLN
500.0000 mL | Freq: Once | INTRAVENOUS | Status: AC
  Administered 2020-08-02: 500 mL via INTRAVENOUS

## 2020-08-02 MED ORDER — LACTATED RINGERS IV SOLN
500.0000 mL | INTRAVENOUS | Status: DC | PRN
Start: 2020-08-02 — End: 2020-08-02

## 2020-08-02 MED ORDER — TERBUTALINE SULFATE 1 MG/ML IJ SOLN: 0.2500 mg | Freq: Once | INTRAMUSCULAR | Status: DC | PRN

## 2020-08-02 MED ORDER — ACETAMINOPHEN 325 MG PO TABS
650.0000 mg | ORAL_TABLET | ORAL | Status: DC | PRN
Start: 2020-08-02 — End: 2020-08-03

## 2020-08-02 MED ORDER — PHENYLEPHRINE 40 MCG/ML (10ML) SYRINGE FOR IV PUSH (FOR BLOOD PRESSURE SUPPORT): 80.0000 ug | PREFILLED_SYRINGE | INTRAVENOUS | Status: DC | PRN

## 2020-08-02 MED ORDER — OXYTOCIN-SODIUM CHLORIDE 30-0.9 UT/500ML-% IV SOLN: 2.5000 [IU]/h | INTRAVENOUS | Status: DC

## 2020-08-02 MED ORDER — DIPHENHYDRAMINE HCL 50 MG/ML IJ SOLN: 12.5000 mg | INTRAMUSCULAR | Status: DC | PRN

## 2020-08-02 MED ORDER — FENTANYL-BUPIVACAINE-NACL 0.5-0.125-0.9 MG/250ML-% EP SOLN
12.0000 mL/h | EPIDURAL | Status: DC | PRN
Start: 2020-08-02 — End: 2020-08-03
  Filled 2020-08-02: qty 250

## 2020-08-02 MED ORDER — LACTATED RINGERS IV SOLN: INTRAVENOUS | Status: DC

## 2020-08-02 MED ORDER — LACTATED RINGERS IV SOLN
500.0000 mL | INTRAVENOUS | Status: DC | PRN
Start: 2020-08-02 — End: 2020-08-03

## 2020-08-02 MED ORDER — ACETAMINOPHEN 325 MG PO TABS: 650.0000 mg | ORAL_TABLET | ORAL | Status: DC | PRN

## 2020-08-02 MED ORDER — OXYTOCIN BOLUS FROM INFUSION: 333.0000 mL | Freq: Once | INTRAVENOUS | Status: DC

## 2020-08-02 NOTE — H&P (Addendum)
OBSTETRIC ADMISSION HISTORY AND PHYSICAL  Kristina Duncan is a 20 y.o. female G51P2002 with IUP at [redacted]w[redacted]d by 8wk ultrasound presenting for SOL. She reports +FMs, No LOF, no VB, no blurry vision, headaches or peripheral edema, and RUQ pain. She plans on breast and bottle feeding. She requests pills for birth control (undecided between POPs or combined OCPs) She received her prenatal [facility] at [facility]   Dating: By 8wk ultrasound --->  Estimated Date of Delivery: 08/09/20  Sono:   '@[redacted]w[redacted]d'$ , CWD, normal anatomy, transverse presentation, 2264g, 77% EFW   Prenatal History/Complications: obesity (BMI 40), vit D deficiency, anemia (hgb 10.3)  Past Medical History: Past Medical History:  Diagnosis Date  . Anemia   . Anxiety   . Chlamydia infection complicating pregnancy 10/19/3265   + chlamydia 08/09/17-->TOC done 09/05/17.    Past Surgical History: Past Surgical History:  Procedure Laterality Date  . NO PAST SURGERIES      Obstetrical History: OB History    Gravida  3   Para  2   Term  2   Preterm      AB      Living  2     SAB      IAB      Ectopic      Multiple  0   Live Births  2           Social History Social History   Socioeconomic History  . Marital status: Single    Spouse name: Not on file  . Number of children: Not on file  . Years of education: Not on file  . Highest education level: Not on file  Occupational History  . Not on file  Tobacco Use  . Smoking status: Never Smoker  . Smokeless tobacco: Never Used  Vaping Use  . Vaping Use: Never used  Substance and Sexual Activity  . Alcohol use: No  . Drug use: No  . Sexual activity: Not Currently    Partners: Male    Birth control/protection: Condom  Other Topics Concern  . Not on file  Social History Narrative  . Not on file   Social Determinants of Health   Financial Resource Strain: Not on file  Food Insecurity: Not on file  Transportation Needs: Not on file  Physical Activity: Not on  file  Stress: Not on file  Social Connections: Not on file    Family History: Family History  Problem Relation Age of Onset  . Down syndrome Brother     Allergies: No Known Allergies  Medications Prior to Admission  Medication Sig Dispense Refill Last Dose  . Pediatric Multiple Vit-C-FA (FLINSTONES GUMMIES OMEGA-3 DHA) CHEW Chew 1 tablet by mouth daily.   08/01/2020 at 2130  . Blood Pressure Monitoring (BLOOD PRESSURE KIT) DEVI 1 each by Does not apply route once a week. To be monitored weekly from home and for virtual visits 1 each 0   . Elastic Bandages & Supports (COMFORT FIT MATERNITY SUPP LG) MISC Please give patient one maternity belt 1 each 0   . ferrous sulfate (FERROUSUL) 325 (65 FE) MG tablet Take 1 tablet (325 mg total) by mouth every other day. (Patient not taking: Reported on 06/02/2020) 30 tablet 3   . omeprazole (PRILOSEC OTC) 20 MG tablet Take 1 tablet (20 mg total) by mouth daily. 30 tablet 3      Review of Systems   All systems reviewed and negative except as stated in HPI  Blood pressure 133/83, pulse  91, temperature 98.6 F (37 C), temperature source Oral, resp. rate 17, height $RemoveBe'5\' 6"'AWMYPqENt$  (1.676 m), weight 111.1 kg, SpO2 100 %, unknown if currently breastfeeding. General appearance: alert, cooperative and no distress Lungs: clear to auscultation bilaterally Heart: regular rate and rhythm Abdomen: soft, non-tender; bowel sounds normal Extremities: Homans sign is negative, no sign of DVT Presentation: cephalic by RN exam Fetal monitoringBaseline: 130 bpm, Variability: Good {> 6 bpm), Accelerations: Reactive and Decelerations: Absent Uterine activity intermittent  Dilation: 5 Effacement (%): 70 Station: -3 Exam by:: Fredda Hammed, RN    Prenatal labs: ABO, Rh: A/Positive/-- (08/19 1018) Antibody: Negative (08/19 1018) Rubella: 3.60 (08/19 1018) RPR: Non Reactive (11/30 0919)  HBsAg: Negative (08/19 1018)  HIV: Non Reactive (11/30 0921)  GBS: Negative/--  (02/01 1458)  3rd trimester 2hr GTT: normal Genetic screening  declined Anatomy US normal  Prenatal Transfer Tool  Maternal Diabetes: No Genetic Screening: Declined Maternal Ultrasounds/Referrals: Normal Maternal Substance Abuse:  No Significant Maternal Medications:  None Significant Maternal Lab Results: Group B Strep negative  No results found for this or any previous visit (from the past 24 hour(s)).  Patient Active Problem List   Diagnosis Date Noted  . Anemia of pregnancy in third trimester 05/14/2020  . Low vitamin D level 01/31/2020  . Supervision of low-risk pregnancy 01/30/2020  . Obesity (BMI 30-39.9) 01/30/2020  . History of chlamydia 11/19/2018  . Obesity in pregnancy 12/01/2017    Assessment/Plan:  Kristina Duncan is a 20 y.o. G3P2002 at [redacted]w[redacted]d here for SOL. She was found to be 5/70/-3 upon presentation to the MAU.  #Labor: Expectant management, anticipate vaginal delivery #Pain: IV pain meds/epidural prn per request #FWB: Cat 1 #ID:  GBS negative #MOF: Breast/bottle #MOC: Pills (POPs vs. combined OCPs) #Circ:  No #BP: elevated to 139/86 on presentation, will send preE labs   Alcus Dad, MD  08/02/2020, 12:56 PM   Attestation of Supervision of Student:  I confirm that I have verified the information documented in the resident student's note and that I have also personally reperformed the history, physical exam and all medical decision making activities.  I have verified that all services and findings are accurately documented in this student's note; and I agree with management and plan as outlined in the documentation. I have also made any necessary editorial changes.  Wende Mott, [place] for [employer], [facility] 08/02/2020 3:56 PM

## 2020-08-02 NOTE — Progress Notes (Addendum)
LABOR PROGRESS NOTE  Kristina Duncan is a 20 y.o. G3P2002 at [redacted]w[redacted]d admitted for SOL.  Subjective: Patient feeling some pressure   Objective: BP 134/83   Pulse 75   Temp 98.8 F (37.1 C) (Oral)   Resp 16   Ht 5\' 6"  (1.676 m)   Wt 111.1 kg   SpO2 100%   BMI 39.54 kg/m  or  Vitals:   08/02/20 1901 08/02/20 1932 08/02/20 2001 08/02/20 2031  BP: 134/73 122/80 123/79 134/83  Pulse: 73 75 72 75  Resp:      Temp:      TempSrc:      SpO2:      Weight:      Height:         Dilation: 6 Effacement (%): 70 Cervical Position: anterior Station: -2 Presentation: Vertex Exam by:: Almond Fitzgibbon MD  FHT: baseline rate 140, moderate varibility, few acels, no decels Toco: ctx not tracing   Labs: Lab Results  Component Value Date   WBC 9.6 08/02/2020   HGB 9.6 (L) 08/02/2020   HCT 31.2 (L) 08/02/2020   MCV 79.4 (L) 08/02/2020   PLT 276 08/02/2020    Patient Active Problem List   Diagnosis Date Noted  . Normal labor 08/02/2020  . Anemia of pregnancy in third trimester 05/14/2020  . Low vitamin D level 01/31/2020  . Supervision of low-risk pregnancy 01/30/2020  . Obesity (BMI 30-39.9) 01/30/2020  . History of chlamydia 11/19/2018  . Obesity in pregnancy 12/01/2017    Assessment / Plan: 20 y.o. G3P2002 at [redacted]w[redacted]d here for SOL.  Labor: s/p AROM at 1730, has made significant change from earlier. If contractions space will start pitocin. Anticipate SVD. Elevated BP:  MR pressure on admit, not yet four hours apart. PEC labs normal. Will monitor.  Microcytic Anemia: Admit hgb 9.6.  Fetal Wellbeing:  Cat 1 Pain Control:  epidural Anticipated MOD:  Vaginal   [redacted]w[redacted]d, MD OB Family Medicine Fellow, [facility] for [facility], [facility]

## 2020-08-02 NOTE — Anesthesia Procedure Notes (Signed)

## 2020-08-02 NOTE — Anesthesia Preprocedure Evaluation (Signed)
Anesthesia Evaluation  Patient identified by MRN, date of birth, ID band Patient awake    Reviewed: Allergy & Precautions, NPO status , Patient's Chart, lab work & pertinent test results  Airway Mallampati: II  TM Distance: >3 FB Neck ROM: Full    Dental no notable dental hx.    Pulmonary neg pulmonary ROS,    Pulmonary exam normal breath sounds clear to auscultation       Cardiovascular negative cardio ROS Normal cardiovascular exam Rhythm:Regular Rate:Normal     Neuro/Psych PSYCHIATRIC DISORDERS Anxiety negative neurological ROS     GI/Hepatic negative GI ROS, Neg liver ROS,   Endo/Other  Morbid obesity (BMI 40)  Renal/GU negative Renal ROS  negative genitourinary   Musculoskeletal negative musculoskeletal ROS (+)   Abdominal   Peds  Hematology  (+) Blood dyscrasia (Hgb 9.6), anemia ,   Anesthesia Other Findings   Reproductive/Obstetrics (+) Pregnancy                             Anesthesia Physical Anesthesia Plan  ASA: III  Anesthesia Plan: Epidural   Post-op Pain Management:    Induction:   PONV Risk Score and Plan: Treatment may vary due to age or medical condition  Airway Management Planned: Natural Airway  Additional Equipment:   Intra-op Plan:   Post-operative Plan:   Informed Consent: I have reviewed the patients History and Physical, chart, labs and discussed the procedure including the risks, benefits and alternatives for the proposed anesthesia with the patient or authorized representative who has indicated his/her understanding and acceptance.       Plan Discussed with: Anesthesiologist  Anesthesia Plan Comments: (Patient identified. Risks, benefits, options discussed with patient including but not limited to bleeding, infection, nerve damage, paralysis, failed block, incomplete pain control, headache, blood pressure changes, nausea, vomiting, reactions to  medication, itching, and post partum back pain. Confirmed with bedside nurse the patient's most recent platelet count. Confirmed with the patient that they are not taking any anticoagulation, have any bleeding history or any family history of bleeding disorders. Patient expressed understanding and wishes to proceed. All questions were answered. )        Anesthesia Quick Evaluation

## 2020-08-02 NOTE — MAU Note (Signed)
Pt presents to unit with complaint of contractions that started around 0700 and were every 4-6 minutes lasting around 15-20 seconds; pt states they aren't as close now. Denies vaginal bleeding and LOF. Reports +FM.

## 2020-08-02 NOTE — Progress Notes (Signed)
LABOR PROGRESS NOTE  Kristina Duncan is a 20 y.o. G3P2002 at [redacted]w[redacted]d admitted for SOL.  Subjective: Patient starting to feel more frequent ctx. Desires epidural and labor augmentation.  Objective: BP 139/86   Pulse 77   Temp 98.3 F (36.8 C)   Resp 16   Ht 5\' 6"  (1.676 m)   Wt 111.1 kg   SpO2 100% Comment: room air  BMI 39.54 kg/m  or  Vitals:   08/02/20 1135 08/02/20 1338 08/02/20 1339  BP: 133/83 139/86   Pulse: 91 77   Resp: 17 16   Temp: 98.6 F (37 C) 98.3 F (36.8 C)   TempSrc: Oral    SpO2: 100%    Weight: 111.1 kg  111.1 kg  Height: 5\' 6"  (1.676 m)  5\' 6"  (1.676 m)     Dilation: 5 Effacement (%): Thick Cervical Position: Posterior Station: -3 Presentation: Vertex Exam by:: , CNM FHT: baseline rate 140, moderate varibility, few acels, no decels Toco: ctx q2-4 mins  Labs: Lab Results  Component Value Date   WBC 9.6 08/02/2020   HGB 9.6 (L) 08/02/2020   HCT 31.2 (L) 08/02/2020   MCV 79.4 (L) 08/02/2020   PLT 276 08/02/2020    Patient Active Problem List   Diagnosis Date Noted  . Normal labor 08/02/2020  . Anemia of pregnancy in third trimester 05/14/2020  . Low vitamin D level 01/31/2020  . Supervision of low-risk pregnancy 01/30/2020  . Obesity (BMI 30-39.9) 01/30/2020  . History of chlamydia 11/19/2018  . Obesity in pregnancy 12/01/2017    Assessment / Plan: 20 y.o. G3P2002 at [redacted]w[redacted]d here for SOL.  Labor: cervical exam unchanged at 5/thick/-3. After discussion with patient, will proceed with AROM after epidural Fetal Wellbeing:  Cat 1 Pain Control:  Desires epidural Anticipated MOD:  Vaginal   12/03/2017, MD PGY-1 Family Medicine Resident 08/02/2020, 4:20 PM

## 2020-08-03 ENCOUNTER — Encounter (HOSPITAL_COMMUNITY): Payer: Self-pay | Admitting: Obstetrics & Gynecology

## 2020-08-03 DIAGNOSIS — Z3A39 39 weeks gestation of pregnancy: Secondary | ICD-10-CM | POA: Diagnosis not present

## 2020-08-03 DIAGNOSIS — O139 Gestational [pregnancy-induced] hypertension without significant proteinuria, unspecified trimester: Secondary | ICD-10-CM

## 2020-08-03 LAB — RPR: RPR Ser Ql: NONREACTIVE

## 2020-08-03 MED ORDER — BENZOCAINE-MENTHOL 20-0.5 % EX AERO
1.0000 "application " | INHALATION_SPRAY | CUTANEOUS | Status: DC | PRN
  Administered 2020-08-03: 1 via TOPICAL
  Filled 2020-08-03: qty 56

## 2020-08-03 MED ORDER — IBUPROFEN 600 MG PO TABS
600.0000 mg | ORAL_TABLET | Freq: Four times a day (QID) | ORAL | Status: DC
  Administered 2020-08-03 – 2020-08-04 (×7): 600 mg via ORAL
  Filled 2020-08-03 (×7): qty 1

## 2020-08-03 MED ORDER — WITCH HAZEL-GLYCERIN EX PADS: 1.0000 "application " | MEDICATED_PAD | CUTANEOUS | Status: DC | PRN

## 2020-08-03 MED ORDER — ONDANSETRON HCL 4 MG PO TABS: 4.0000 mg | ORAL_TABLET | ORAL | Status: DC | PRN

## 2020-08-03 MED ORDER — ONDANSETRON HCL 4 MG/2ML IJ SOLN: 4.0000 mg | INTRAMUSCULAR | Status: DC | PRN

## 2020-08-03 MED ORDER — COCONUT OIL OIL
1.0000 "application " | TOPICAL_OIL | Status: DC | PRN
  Administered 2020-08-04: 1 via TOPICAL

## 2020-08-03 MED ORDER — TETANUS-DIPHTH-ACELL PERTUSSIS 5-2.5-18.5 LF-MCG/0.5 IM SUSY: 0.5000 mL | PREFILLED_SYRINGE | Freq: Once | INTRAMUSCULAR | Status: DC

## 2020-08-03 MED ORDER — SENNOSIDES-DOCUSATE SODIUM 8.6-50 MG PO TABS
2.0000 | ORAL_TABLET | ORAL | Status: DC
  Administered 2020-08-03 – 2020-08-04 (×2): 2 via ORAL
  Filled 2020-08-03 (×2): qty 2

## 2020-08-03 MED ORDER — DIPHENHYDRAMINE HCL 25 MG PO CAPS: 25.0000 mg | ORAL_CAPSULE | Freq: Four times a day (QID) | ORAL | Status: DC | PRN

## 2020-08-03 MED ORDER — DIBUCAINE (PERIANAL) 1 % EX OINT: 1.0000 "application " | TOPICAL_OINTMENT | CUTANEOUS | Status: DC | PRN

## 2020-08-03 MED ORDER — PRENATAL MULTIVITAMIN CH
1.0000 | ORAL_TABLET | Freq: Every day | ORAL | Status: DC
  Administered 2020-08-03 – 2020-08-04 (×2): 1 via ORAL
  Filled 2020-08-03 (×2): qty 1

## 2020-08-03 MED ORDER — SIMETHICONE 80 MG PO CHEW: 80.0000 mg | CHEWABLE_TABLET | ORAL | Status: DC | PRN

## 2020-08-03 MED ORDER — DOCUSATE SODIUM 100 MG PO CAPS
100.0000 mg | ORAL_CAPSULE | Freq: Two times a day (BID) | ORAL | Status: DC
  Administered 2020-08-03 – 2020-08-04 (×2): 100 mg via ORAL
  Filled 2020-08-03 (×2): qty 1

## 2020-08-03 MED ORDER — ACETAMINOPHEN 325 MG PO TABS
650.0000 mg | ORAL_TABLET | ORAL | Status: DC | PRN
Start: 2020-08-03 — End: 2020-08-04
  Administered 2020-08-03: 650 mg via ORAL
  Filled 2020-08-03: qty 2

## 2020-08-03 NOTE — Discharge Summary (Signed)
Postpartum Discharge Summary    Patient Name: Kristina Duncan DOB: 05/08/2001 MRN: [id number]  Date of admission: 08/02/2020 Delivery date:08/03/2020  Delivering provider: Janet Berlin  Date of discharge: 08/04/2020  Admitting diagnosis: Normal labor [O80, Z37.9] Intrauterine pregnancy: [redacted]w[redacted]d     Secondary diagnosis:  Principal Problem:   Vaginal delivery Active Problems:   Obesity in pregnancy   History of chlamydia   Anemia of pregnancy in third trimester   Gestational hypertension  Additional problems: as noted above   Discharge diagnosis: Term Pregnancy Delivered, gHTN                                       Post partum procedures:none Augmentation: AROM, Pitocin Complications: None  Hospital course: Onset of Labor With Vaginal Delivery      20 y.o. yo [id number] at [redacted]w[redacted]d was admitted in Latent Labor on 08/02/2020. Patient had an uncomplicated labor course as follows:  Membrane Rupture Time/Date: 5:32 PM ,08/02/2020   Delivery Method:Vaginal, Spontaneous  Episiotomy: None  Lacerations:  None  Patient had an uncomplicated postpartum course.  She is ambulating, tolerating a regular diet, passing flatus, and urinating well. Patient is discharged home in stable condition on 08/04/20. Plan for 1 week blood pressure check in clinic.  Newborn Data: Birth date:08/03/2020  Birth time:3:57 AM  Gender:Female  Living status:Living  Apgars:9 ,9  Weight:3456 g   Magnesium Sulfate received: No BMZ received: No Rhophylac:N/A MMR:N/A T-DaP:Given prenatally Flu: Yes Transfusion:No  Physical exam  Vitals:   08/03/20 1400 08/03/20 1835 08/03/20 1955 08/04/20 0514  BP: 129/73 129/78 (!) 141/80 122/67  Pulse: 82 80 91 78  Resp: $Remo'16 16 18 18  'fQksI$ Temp: 98.3 F (36.8 C) 98.3 F (36.8 C) 98.9 F (37.2 C) 98.4 F (36.9 C)  TempSrc: Oral Oral Oral Oral  SpO2:   98% 99%  Weight:      Height:       General: alert, cooperative and no distress Lochia: appropriate Uterine Fundus:  firm Incision: N/A DVT Evaluation: No evidence of DVT seen on physical exam. No cords or calf tenderness. No significant calf/ankle edema. Labs: Lab Results  Component Value Date   WBC 9.6 08/02/2020   HGB 9.6 (L) 08/02/2020   HCT 31.2 (L) 08/02/2020   MCV 79.4 (L) 08/02/2020   PLT 276 08/02/2020   CMP Latest Ref Rng & Units 08/02/2020  Glucose 70 - 99 mg/dL 71  BUN 6 - 20 mg/dL 6  Creatinine 0.44 - 1.00 mg/dL 0.69  Sodium 135 - 145 mmol/L 135  Potassium 3.5 - 5.1 mmol/L 4.2  Chloride 98 - 111 mmol/L 107  CO2 22 - 32 mmol/L 21(L)  Calcium 8.9 - 10.3 mg/dL 8.6(L)  Total Protein 6.5 - 8.1 g/dL 6.5  Total Bilirubin 0.3 - 1.2 mg/dL 0.4  Alkaline Phos 38 - 126 U/L 164(H)  AST 15 - 41 U/L 14(L)  ALT 0 - 44 U/L 11   Edinburgh Score: Edinburgh Postnatal Depression Scale Screening Tool 08/04/2020  I have been able to laugh and see the funny side of things. 0  I have looked forward with enjoyment to things. 0  I have blamed myself unnecessarily when things went wrong. 1  I have been anxious or worried for no good reason. 0  I have felt scared or panicky for no good reason. 0  Things have been getting on top of me. 1  I have been so unhappy that I have had difficulty sleeping. 0  I have felt sad or miserable. 0  I have been so unhappy that I have been crying. 0  The thought of harming myself has occurred to me. 0  Edinburgh Postnatal Depression Scale Total 2     After visit meds:  Allergies as of 08/04/2020   No Known Allergies     Medication List    STOP taking these medications   Blood Pressure Kit Devi   Comfort Fit Maternity Supp Lg Misc   omeprazole 20 MG tablet Commonly known as: PriLOSEC OTC     TAKE these medications   acetaminophen 325 MG tablet Commonly known as: Tylenol Take 2 tablets (650 mg total) by mouth every 6 (six) hours as needed for mild pain, moderate pain, fever or headache.   coconut oil Oil Apply 1 application topically as needed (nipple  pain).   ferrous sulfate 325 (65 FE) MG tablet Commonly known as: FerrouSul Take 1 tablet (325 mg total) by mouth every other day.   Flinstones Gummies Omega-3 DHA Chew Chew 1 tablet by mouth daily.   ibuprofen 600 MG tablet Commonly known as: ADVIL Take 1 tablet (600 mg total) by mouth every 8 (eight) hours as needed for moderate pain or cramping.        Discharge home in stable condition Infant Feeding: Bottle and Breast Infant Disposition:home with mother Discharge instruction: per After Visit Summary and Postpartum booklet. Activity: Advance as tolerated. Pelvic rest for 6 weeks.  Diet: routine diet Future Appointments: Future Appointments  Date Time Provider [place]  08/12/2020 11:00 AM [facility] NURSE [facility]  09/09/2020 10:50 AM Starr [place], CNM [facility]   Follow up Visit:  Please schedule this patient for a In person postpartum visit in 6 weeks with the following provider: Any provider. Additional Postpartum F/U:BP check 1 week  Low risk pregnancy complicated by: HTN Delivery mode:  Vaginal, Spontaneous  Anticipated Birth Control:  Vanessa [place], Gildardo Cranker, MD OB Fellow, [facility] 08/04/2020 8:45 AM

## 2020-08-03 NOTE — Social Work (Signed)
CSW received consult for hx of Anxiety.  CSW met with [name] to offer support and complete assessment.     CSW introduced self and role. CSW observed [name] holding infant '[name]'. CSW informed [name] of reason for consult. [name] was understanding and pleasant throughout assessment. [name] reported she has had anxiety since the age of 11 or 12. [name] stated she experienced a little during pregnancy and sleeping helped. [name] disclosed she attended therapy at age 20, but has not had any additional therapy. [name] has never been prescribed medication to treat. [name] reported she is currently feeling good and was observed smiling at infant. [name] identified her mother as a support and denies any current SI, HI or DV.  CSW provided education regarding the baby blues period versus perinatal mood disorders, discussed treatment and provided resources for mental health follow up if concerns arise.  CSW recommended self-evaluation during the postpartum time period using the New Mom Checklist and encouraged [name] to contact a medical professional if symptoms are noted at any time.   CSW provided review of Sudden Infant Death Syndrome (SIDS) precautions.  [name] reported she has all essential items for infant. [name] denies any barriers to follow-up care and declined having any additional needs at this time.  CSW identifies no further need for intervention and no barriers to discharge at this time.  Adaleena Mooers, LCSWA Clinical Social Work [facility] [phone]  

## 2020-08-03 NOTE — Progress Notes (Signed)
LABOR PROGRESS NOTE  Kristina Duncan is a 20 y.o. G3P2002 at [redacted]w[redacted]d admitted for SOL.  Subjective: Doing well, resting   Objective: BP 122/67   Pulse (!) 106   Temp 98.8 F (37.1 C) (Oral)   Resp 16   Ht 5\' 6"  (1.676 m)   Wt 111.1 kg   SpO2 100%   BMI 39.54 kg/m  or  Vitals:   08/02/20 2201 08/02/20 2231 08/02/20 2301 08/02/20 2331  BP: 129/65 128/68 129/70 122/67  Pulse: 74 70 67 (!) 106  Resp:    16  Temp:      TempSrc:      SpO2:      Weight:      Height:         Dilation: 7 Effacement (%): 90 Cervical Position: anterior Station: -2 Presentation: Vertex  FHT: baseline rate 120, moderate varibility, few acels, no decels Toco: q64min   Labs: Lab Results  Component Value Date   WBC 9.6 08/02/2020   HGB 9.6 (L) 08/02/2020   HCT 31.2 (L) 08/02/2020   MCV 79.4 (L) 08/02/2020   PLT 276 08/02/2020    Patient Active Problem List   Diagnosis Date Noted  . Normal labor 08/02/2020  . Anemia of pregnancy in third trimester 05/14/2020  . Low vitamin D level 01/31/2020  . Supervision of low-risk pregnancy 01/30/2020  . Obesity (BMI 30-39.9) 01/30/2020  . History of chlamydia 11/19/2018  . Obesity in pregnancy 12/01/2017    Assessment / Plan: 20 y.o. G3P2002 at [redacted]w[redacted]d here for SOL.  Labor: s/p AROM at 1730, pitocin started 2325. Anticipate SVD Elevated BP:  MR pressure on admit, not yet four hours apart. PEC labs normal. Will monitor.  Microcytic Anemia: Admit hgb 9.6.  Fetal Wellbeing:  Cat 1 Pain Control:  epidural Anticipated MOD:  Vaginal   [id number], MD OB Family Medicine Fellow, [facility] for [facility], [facility]

## 2020-08-03 NOTE — Anesthesia Postprocedure Evaluation (Signed)
Anesthesia Post Note  Patient: Kristina Duncan  Procedure(s) Performed: AN AD HOC LABOR EPIDURAL     Patient location during evaluation: Mother Baby Anesthesia Type: Epidural Level of consciousness: awake and alert Pain management: pain level controlled Vital Signs Assessment: post-procedure vital signs reviewed and stable Respiratory status: spontaneous breathing, nonlabored ventilation and respiratory function stable Cardiovascular status: stable Postop Assessment: no headache, no backache and epidural receding Anesthetic complications: no   No complications documented.  Last Vitals:  Vitals:   08/03/20 0550 08/03/20 0650  BP: 137/90 139/82  Pulse: 75 86  Resp: 18 18  Temp: 36.6 C 36.7 C  SpO2: 100% 100%    Last Pain:  Vitals:   08/03/20 0650  TempSrc: Axillary  PainSc: 0-No pain   Pain Goal:                Epidural/Spinal Function Cutaneous sensation: Able to Wiggle Toes (08/03/20 0650), Patient able to flex knees: Yes (08/03/20 0650), Patient able to lift hips off bed: Yes (08/03/20 0650), Back pain beyond tenderness at insertion site: No (08/03/20 0650), Progressively worsening motor and/or sensory loss: No (08/03/20 0650), Bowel and/or bladder incontinence post epidural: No (08/03/20 0650)  Fanny Dance

## 2020-08-03 NOTE — Lactation Note (Signed)
This note was copied from a baby's chart. Lactation Consultation Note  Patient Name: Kristina Duncan [id number]'[id number] Date: 08/03/2020 Reason for consult: Follow-up assessment Age:21 hours   P3 mother whose infant is now 3 hours old.  This is a term baby at 39+1 weeks.  Mother breast fed her first child (now 31 years old) for 3 months and her second child (now 71 years old) for a couple of weeks.  Baby was swaddled and asleep when I arrived.  Breast feeding basics reviewed.  Mother has been able to express colostrum and I encouraged finger/spoon feeding with any EBM she is able to obtain.  Mother will feed 8-12 times/24 hours or sooner if baby shows feeding cues.  Suggested she call her RN/LC for latch assistance as needed.  Mom made aware of O/P services, breastfeeding support groups, community resources, and our phone # for post-discharge questions. Mother has ordered a DEBP for home use.  No support person present at this time.   Maternal Data    Feeding    LATCH Score                    Lactation Tools Discussed/Used    Interventions    Discharge    Consult Status Consult Status: Follow-up Date: 08/04/20 Follow-up type: In-patient    Kellen Dutch R Keyanni Whittinghill 08/03/2020, 1:30 PM

## 2020-08-04 ENCOUNTER — Encounter: Payer: Medicaid Other | Admitting: Obstetrics and Gynecology

## 2020-08-04 MED ORDER — IBUPROFEN 600 MG PO TABS: 600.0000 mg | ORAL_TABLET | Freq: Three times a day (TID) | ORAL | 0 refills | Status: AC | PRN

## 2020-08-04 MED ORDER — COCONUT OIL OIL: 1.0000 "application " | TOPICAL_OIL | 0 refills | Status: AC | PRN

## 2020-08-04 MED ORDER — ACETAMINOPHEN 325 MG PO TABS: 650.0000 mg | ORAL_TABLET | Freq: Four times a day (QID) | ORAL | Status: AC | PRN

## 2020-08-04 MED ORDER — FERROUS SULFATE 325 (65 FE) MG PO TABS: 325.0000 mg | ORAL_TABLET | ORAL | 1 refills | Status: AC

## 2020-08-04 NOTE — Progress Notes (Addendum)
POSTPARTUM PROGRESS NOTE  Post Partum Day 1  Subjective:  Kristina Duncan is a 20 y.o. [id number] s/p vaginal delivery at [redacted]w[redacted]d.  No acute events overnight.  Pt denies problems with ambulating, voiding or po intake.  She denies nausea or vomiting.  Pain is well controlled.  Shehas had both flatus and a bowel movement. Lochia described as less than usual menses.   Objective: Blood pressure 122/67, pulse 78, temperature 98.4 F (36.9 C), temperature source Oral, resp. rate 18, height 5\' 6"  (1.676 m), weight 111.1 kg, SpO2 99 %, currently breastfeeding while in hospital.  Physical Exam:  General: alert, cooperative and no distress Chest: no respiratory distress Heart: regular rate, distal pulses intact Abdomen: soft, nontender Uterine Fundus: firm and below umbilicus, appropriately tender DVT Evaluation: No calf swelling or tenderness Extremities: no swelling or edema Skin: warm, dry  Recent Labs    08/02/20 1315  HGB 9.6*  HCT 31.2*    Assessment/Plan: Kristina Duncan is a 20 y.o. 26 s/p vaginal delivery at [redacted]w[redacted]d.   1. PPD#1 -Doing well -Contraception: Patient plans on pursuing contraception outpatient -Feeding: Breastfed and formula   2. Anemia: Hgb 9.6 on admission. - Will continue PO iron supplement every other day  Dispo: Plan for discharge today pending neonate clearance    [redacted]w[redacted]d, Student-PA,  08/04/2020, 8:48 AM   Attestation of Supervision of Student:  I confirm that I have verified the information documented in the physician assistant student's note and that I have also personally reperformed the history, physical exam and all medical decision making activities.  I have verified that all services and findings are accurately documented in this student's note; and I agree with management and plan as outlined in the documentation. I have also made any necessary editorial changes.  08/06/2020, MD Center for [facility], [facility] 08/04/2020 9:23 AM

## 2020-08-04 NOTE — Lactation Note (Addendum)
This note was copied from a baby's chart. Lactation Consultation Note  Patient Name: Kristina Duncan [id number] Date: 08/04/2020 Reason for consult: Follow-up assessment Age:21 hours   P1 mother whose infant is now 64 hours old.  This is a term baby at 39+1 weeks.  Mother breast fed her first child (now 31 years old) for 3 months and her second child (now 75 years old) for a couple of weeks.  Baby's bilirubin level is 8.2 mg/dl at 24 hours of life.  Baby was swaddled and asleep in mother's lap when I arrived.  Mother had no questions/concerns related to breast feeding.  She will continue to feed on cue.  Mother has ordered a DEBP for home use.    Engorgement prevention/treatment reviewed.  Provided a manual pump with instructions for use.  #24 flange size is appropriate at this time.  No support person present.  Suggested mother call for latch assistance with the next feeding.    Maternal Data    Feeding    LATCH Score                    Lactation Tools Discussed/Used    Interventions Interventions: Education  Discharge Discharge Education: Engorgement and breast care  Consult Status Consult Status: Complete Date: 08/04/20 Follow-up type: Call as needed    Keeven Matty R Mareesa Gathright 08/04/2020, 8:01 AM

## 2020-09-09 ENCOUNTER — Ambulatory Visit: Payer: Medicaid Other | Admitting: Student

## 2020-09-10 ENCOUNTER — Telehealth: Payer: Self-pay | Admitting: Radiology

## 2020-09-10 NOTE — Telephone Encounter (Signed)
Tried calling patient to reschedule missed appointment on 09/09/20, number not working
